# Patient Record
Sex: Female | Born: 1965 | Race: White | Hispanic: No | Smoking: Current every day smoker
Health system: Southern US, Community
[De-identification: ages and names within clinical notes are randomized; demographics above are authoritative.]

## PROBLEM LIST (undated history)

## (undated) DIAGNOSIS — C801 Malignant (primary) neoplasm, unspecified: Secondary | ICD-10-CM

## (undated) DIAGNOSIS — I10 Essential (primary) hypertension: Secondary | ICD-10-CM

## (undated) DIAGNOSIS — E079 Disorder of thyroid, unspecified: Secondary | ICD-10-CM

## (undated) HISTORY — PX: APPENDECTOMY: SHX54

## (undated) HISTORY — PX: DENTAL SURGERY: SHX609

## (undated) HISTORY — DX: Disorder of thyroid, unspecified: E07.9

---

## 2007-11-15 HISTORY — PX: ABDOMINAL HYSTERECTOMY: SHX81

## 2008-11-14 HISTORY — PX: TONSILLECTOMY: SUR1361

## 2012-11-14 DIAGNOSIS — I10 Essential (primary) hypertension: Secondary | ICD-10-CM

## 2012-11-14 HISTORY — DX: Essential (primary) hypertension: I10

## 2013-10-25 ENCOUNTER — Emergency Department: Payer: Self-pay | Admitting: Emergency Medicine

## 2013-11-04 ENCOUNTER — Emergency Department: Payer: Self-pay | Admitting: Emergency Medicine

## 2013-11-08 ENCOUNTER — Emergency Department: Payer: Self-pay | Admitting: Emergency Medicine

## 2015-05-22 ENCOUNTER — Ambulatory Visit
Admission: EM | Admit: 2015-05-22 | Discharge: 2015-05-22 | Disposition: A | Discharge status: Home / Self Care | Payer: Managed Care, Other (non HMO) | Attending: Family Medicine | Admitting: Family Medicine

## 2015-05-22 DIAGNOSIS — R0602 Shortness of breath: Secondary | ICD-10-CM | POA: Insufficient documentation

## 2015-05-22 DIAGNOSIS — J9801 Acute bronchospasm: Secondary | ICD-10-CM

## 2015-05-22 DIAGNOSIS — I10 Essential (primary) hypertension: Secondary | ICD-10-CM | POA: Diagnosis not present

## 2015-05-22 HISTORY — DX: Essential (primary) hypertension: I10

## 2015-05-22 LAB — BASIC METABOLIC PANEL
Anion gap: 13 (ref 5–15)
BUN: 13 mg/dL (ref 6–20)
CO2: 25 mmol/L (ref 22–32)
CREATININE: 0.99 mg/dL (ref 0.44–1.00)
Calcium: 9.9 mg/dL (ref 8.9–10.3)
Chloride: 95 mmol/L — ABNORMAL LOW (ref 101–111)
GFR calc Af Amer: >60 mL/min (ref >60)
GFR calc non Af Amer: >60 mL/min (ref >60)
Glucose, Bld: 103 mg/dL — ABNORMAL HIGH (ref 65–99)
POTASSIUM: 3.6 mmol/L (ref 3.5–5.1)
Sodium: 133 mmol/L — ABNORMAL LOW (ref 135–145)

## 2015-05-22 MED ORDER — ALBUTEROL SULFATE HFA 108 (90 BASE) MCG/ACT IN AERS: 1.0000 | INHALATION_SPRAY | Freq: Four times a day (QID) | RESPIRATORY_TRACT | Status: DC | PRN

## 2015-05-22 MED ORDER — HYDROCHLOROTHIAZIDE 12.5 MG PO TABS: 12.5000 mg | ORAL_TABLET | Freq: Every day | ORAL | Status: DC

## 2015-05-22 NOTE — ED Notes (Signed)
Pt states "I have had shortness of breath and some chest pain over the last few days. I have been off my blood pressure medicines for years. I stopped it because it made me feel bad."

## 2015-05-22 NOTE — ED Provider Notes (Signed)
CSN: [id number]     Arrival date & time 05/22/15  1133 History   First MD Initiated Contact with Patient 05/22/15 1246     Chief Complaint  Patient presents with  . Shortness of Breath  . Hypertension   (Consider location/radiation/quality/duration/timing/severity/associated sxs/prior Treatment) HPI Comments: 49 yo female with at least 3 months h/o intermittent shortness of breath and intermittent epigastric pains. States has a h/o hypertension, however self discontinued her medications because "I didn't want to take any more pills". Patient is also a smoker,states gets occasional wheezing, and has used albuterol in the past. Has also been taking otc "BC powders". Currently denies any chest pains or shortness of breath.   Patient is a 49 y.o. female presenting with shortness of breath and hypertension. The history is provided by the patient.  Shortness of Breath Severity:  Mild Onset quality:  Gradual Duration:  3 months Timing:  Intermittent Progression:  Waxing and waning Context: activity   Hypertension Associated symptoms include shortness of breath.    Past Medical History  Diagnosis Date  . Hypertension    Past Surgical History  Procedure Laterality Date  . Cesarean section    . Tonsillectomy    . Appendectomy    . Abdominal hysterectomy     No family history on file. History  Substance Use Topics  . Smoking status: Current Every Day Smoker -- 1.00 packs/day  . Smokeless tobacco: Not on file  . Alcohol Use: 1.2 - 2.4 oz/week    2-4 Cans of beer per week     Comment: 2-4 q day   OB History    No data available     Review of Systems  Respiratory: Positive for shortness of breath.     Allergies  Review of patient's allergies indicates no known allergies.  Home Medications   Prior to Admission medications   Medication Sig Start Date End Date Taking? Authorizing Provider  Aspirin-Salicylamide-Caffeine (BC HEADACHE POWDER PO) Take by mouth.   Yes Historical  Provider, MD  ibuprofen (ADVIL,MOTRIN) 200 MG tablet Take 400 mg by mouth every 6 (six) hours as needed.   Yes Historical Provider, MD  albuterol (PROVENTIL HFA;VENTOLIN HFA) 108 (90 BASE) MCG/ACT inhaler Inhale 1-2 puffs into the lungs every 6 (six) hours as needed for wheezing or shortness of breath. 05/22/15   Norval Gable, MD  hydrochlorothiazide (HYDRODIURIL) 12.5 MG tablet Take 1 tablet (12.5 mg total) by mouth daily. 05/22/15   Norval Gable, MD   BP 151/98 mmHg  Pulse 90  Temp(Src) 98.1 F (36.7 C) (Tympanic)  Resp 16  Ht 5' 0.5" (1.537 m)  Wt 148 lb (67.132 kg)  BMI 28.42 kg/m2  SpO2 100%  LMP  Physical Exam  Constitutional: She appears well-developed and well-nourished. No distress.  HENT:  Head: Normocephalic.  Nose: Nose normal.  Mouth/Throat: Oropharynx is clear and moist and mucous membranes are normal.  Eyes: Conjunctivae and EOM are normal. Pupils are equal, round, and reactive to light. Right eye exhibits no discharge. Left eye exhibits no discharge. No scleral icterus.  Neck: Normal range of motion. Neck supple. No JVD present. No tracheal deviation present. No thyromegaly present.  Cardiovascular: Normal rate, regular rhythm, normal heart sounds and intact distal pulses.   No murmur heard. Pulmonary/Chest: Effort normal. No stridor. No respiratory distress. She has wheezes (few expiratory). She has no rales. She exhibits no tenderness.  Abdominal: Soft. Bowel sounds are normal. She exhibits no distension. There is no tenderness. There is no rebound  and no guarding.  Musculoskeletal: She exhibits no edema.  Lymphadenopathy:    She has no cervical adenopathy.  Neurological: She is alert.  Skin: Skin is warm and dry. No rash noted. She is not diaphoretic.  Vitals reviewed.   ED Course  Procedures (including critical care time) Labs Review Labs Reviewed  BASIC METABOLIC PANEL - Abnormal; Notable for the following:    Sodium 133 (*)    Chloride 95 (*)    Glucose,  Bld 103 (*)    All other components within normal limits    Imaging Review No results found.  EKG: normal EKG, normal sinus rhythm, there are no previous tracings available for comparison; reviewed by me and agree with computerized print out. MDM   1. Bronchospasm   2. Essential hypertension    Discharge Medication List as of 05/22/2015  1:54 PM    START taking these medications   Details  albuterol (PROVENTIL HFA;VENTOLIN HFA) 108 (90 BASE) MCG/ACT inhaler Inhale 1-2 puffs into the lungs every 6 (six) hours as needed for wheezing or shortness of breath., Starting 05/22/2015, Until Discontinued, Normal    hydrochlorothiazide (HYDRODIURIL) 12.5 MG tablet Take 1 tablet (12.5 mg total) by mouth daily., Starting 05/22/2015, Until Discontinued, Normal      Plan: 1. Test results and diagnosis reviewed with patient 2. rx as per orders; risks, benefits, potential side effects reviewed with patient 3. Recommend smoking cessation and establish with a PCP in the next 1-2 weeks 4. F/u prn if symptoms worsen or don't improve    Norval Gable, MD 05/22/15 1401

## 2015-06-01 ENCOUNTER — Ambulatory Visit (INDEPENDENT_AMBULATORY_CARE_PROVIDER_SITE_OTHER): Payer: Managed Care, Other (non HMO) | Admitting: Family Medicine

## 2015-06-01 ENCOUNTER — Encounter: Payer: Self-pay | Admitting: Family Medicine

## 2015-06-01 VITALS — BP 174/100 | HR 88 | Ht 61.5 in | Wt 150.2 lb

## 2015-06-01 DIAGNOSIS — Z0000 Encounter for general adult medical examination without abnormal findings: Secondary | ICD-10-CM

## 2015-06-01 DIAGNOSIS — M545 Low back pain, unspecified: Secondary | ICD-10-CM | POA: Insufficient documentation

## 2015-06-01 DIAGNOSIS — Z720 Tobacco use: Secondary | ICD-10-CM

## 2015-06-01 DIAGNOSIS — E663 Overweight: Secondary | ICD-10-CM | POA: Insufficient documentation

## 2015-06-01 DIAGNOSIS — E039 Hypothyroidism, unspecified: Secondary | ICD-10-CM | POA: Diagnosis not present

## 2015-06-01 DIAGNOSIS — R739 Hyperglycemia, unspecified: Secondary | ICD-10-CM

## 2015-06-01 DIAGNOSIS — J449 Chronic obstructive pulmonary disease, unspecified: Secondary | ICD-10-CM

## 2015-06-01 DIAGNOSIS — F17200 Nicotine dependence, unspecified, uncomplicated: Secondary | ICD-10-CM | POA: Insufficient documentation

## 2015-06-01 DIAGNOSIS — I10 Essential (primary) hypertension: Secondary | ICD-10-CM | POA: Diagnosis not present

## 2015-06-01 DIAGNOSIS — E559 Vitamin D deficiency, unspecified: Secondary | ICD-10-CM

## 2015-06-01 MED ORDER — LISINOPRIL-HYDROCHLOROTHIAZIDE 10-12.5 MG PO TABS: 1.0000 | ORAL_TABLET | Freq: Every day | ORAL | Status: DC

## 2015-06-01 NOTE — Progress Notes (Signed)
Date:  06/01/2015   Name:  Tamara Olson   DOB:  1966/04/04   MRN:  [id number]  PCP:  No PCP Per Patient    Chief Complaint: Hypertension   History of Present Illness:  This is a 50 y.o. female seen last week at [facility] and started on low dose HCTZ and albuterol inhaler for HTN and bronchospasm. Had been on both meds in past but recently reestablished insurance. Using albuterol prn only when weather hot. Smoker for many years, not interested in quitting at present. Takes ibuprofen/BC powder prn back pain. Weight stable, gets exercise working at [employer] No gyn exam or mammogram in 8 years due to no insurance, no colonoscopy in past, last tetanus 3 yrs ago.  Review of Systems:  Review of Systems  Constitutional: Negative for fever, appetite change and unexpected weight change.  HENT: Negative for ear pain and sore throat.   Eyes: Negative for pain.  Respiratory: Positive for shortness of breath and wheezing. Negative for cough.   Cardiovascular: Negative for chest pain, palpitations and leg swelling.  Gastrointestinal: Negative for abdominal pain.  Endocrine: Negative for polyuria.  Genitourinary: Negative for dysuria and difficulty urinating.  Musculoskeletal: Positive for back pain. Negative for joint swelling.  Skin: Negative for rash.  Neurological: Negative for seizures and syncope.  Hematological: Negative for adenopathy.  Psychiatric/Behavioral: Negative for sleep disturbance and decreased concentration.    Patient Active Problem List   Diagnosis Date Noted  . Overweight 06/01/2015  . Essential hypertension 06/01/2015  . Smoker 06/01/2015  . 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 Intermittent low back pain 06/01/2015    Prior to Admission medications   Medication Sig Start Date End Date Taking? Authorizing Provider  albuterol (PROVENTIL HFA;VENTOLIN HFA) 108 (90 BASE) MCG/ACT inhaler Inhale 1-2 puffs into the lungs every 6 (six) hours as needed for wheezing or shortness of breath. 05/22/15   Yes Norval Gable, MD  Aspirin-Salicylamide-Caffeine (BC HEADACHE POWDER PO) Take by mouth.   Yes Historical Provider, MD  ibuprofen (ADVIL,MOTRIN) 200 MG tablet Take 400 mg by mouth every 6 (six) hours as needed.   Yes Historical Provider, MD  lisinopril-hydrochlorothiazide (PRINZIDE,ZESTORETIC) 10-12.5 MG per tablet Take 1 tablet by mouth daily. 06/01/15   Adline Potter, MD    No Known Allergies  Past Surgical History  Procedure Laterality Date  . Cesarean section    . Tonsillectomy    . Appendectomy    . Abdominal hysterectomy      History  Substance Use Topics  . Smoking status: Current Every Day Smoker -- 1.00 packs/day  . Smokeless tobacco: Not on file  . Alcohol Use: 1.2 - 2.4 oz/week    2-4 Cans of beer per week     Comment: 2-4 q day    Family History  Problem Relation Age of Onset  . Hypertension Mother   . Vision loss Mother   . Hypertension Father   . Heart disease Father   . Vision loss Father   . Hypothyroidism Sister   . Hypertension Sister   . Vision loss Sister     Medication list has been reviewed and updated.  Physical Examination: BP 174/100 mmHg  Pulse 88  Ht 5' 1.5" (1.562 m)  Wt 150 lb 3.2 oz (68.13 kg)  BMI 27.92 kg/m2  Physical Exam  Constitutional: She is oriented to person, place, and time. She appears well-developed and well-nourished.  HENT:  Head: Normocephalic and atraumatic.  Right Ear: External ear normal.  Left Ear: External ear  normal.  Mouth/Throat: Oropharynx is clear and moist.  Eyes: Conjunctivae and EOM are normal. Pupils are equal, round, and reactive to light.  Neck: No thyromegaly present.  Cardiovascular: Normal rate, regular rhythm and normal heart sounds.   Pulmonary/Chest: Effort normal and breath sounds normal.  Abdominal: Soft. She exhibits no distension and no mass. There is no tenderness.  Musculoskeletal: She exhibits no edema.  Lymphadenopathy:    She has no cervical adenopathy.  Neurological: She is  alert and oriented to person, place, and time. Coordination normal.  Skin: No rash noted.  Psychiatric: She has a normal mood and affect. Her behavior is normal.    Assessment and Plan:  1. Essential hypertension Poor control, convert to ACEI/HCTZ - CBC - Lipid Profile - lisinopril-hydrochlorothiazide (PRINZIDE,ZESTORETIC) 10-12.5 MG per tablet; Take 1 tablet by mouth daily.  Dispense: 30 tablet; Refill: 2  2. Overweight Discussed weight loss/exercise - TSH - Comprehensive Metabolic Panel (CMET) - Vitamin D (25 hydroxy)  3. Hyperglycemia - HgB A1c  4. COPD, mild Discussed need to quit smoking and prn albuterol use only  5. Smoker  6. Intermittent low back pain  7. Routine health maintenance - Ambulatory referral to Gynecology   Return in about 4 weeks (around 06/29/2015).  Satira Anis. [facility]  06/01/2015

## 2015-06-02 DIAGNOSIS — E559 Vitamin D deficiency, unspecified: Secondary | ICD-10-CM | POA: Insufficient documentation

## 2015-06-02 DIAGNOSIS — E039 Hypothyroidism, unspecified: Secondary | ICD-10-CM | POA: Insufficient documentation

## 2015-06-02 LAB — COMPREHENSIVE METABOLIC PANEL
ALT: 11 IU/L (ref 0–32)
AST: 15 IU/L (ref 0–40)
Albumin/Globulin Ratio: 2 (ref 1.1–2.5)
Albumin: 4.3 g/dL (ref 3.5–5.5)
Alkaline Phosphatase: 56 IU/L (ref 39–117)
BUN/Creatinine Ratio: 13 (ref 9–23)
BUN: 15 mg/dL (ref 6–24)
Bilirubin Total: <0.2 mg/dL (ref 0.0–1.2)
CHLORIDE: 97 mmol/L (ref 97–108)
CO2: 22 mmol/L (ref 18–29)
Calcium: 9.7 mg/dL (ref 8.7–10.2)
Creatinine, Ser: 1.12 mg/dL — ABNORMAL HIGH (ref 0.57–1.00)
GFR calc Af Amer: 66 mL/min/{1.73_m2} (ref >59)
GFR calc non Af Amer: 57 mL/min/{1.73_m2} — ABNORMAL LOW (ref >59)
GLOBULIN, TOTAL: 2.2 g/dL (ref 1.5–4.5)
Glucose: 87 mg/dL (ref 65–99)
Potassium: 3.8 mmol/L (ref 3.5–5.2)
SODIUM: 136 mmol/L (ref 134–144)
TOTAL PROTEIN: 6.5 g/dL (ref 6.0–8.5)

## 2015-06-02 LAB — TSH: TSH: 7.54 u[IU]/mL — ABNORMAL HIGH (ref 0.450–4.500)

## 2015-06-02 LAB — CBC
Hematocrit: 43.5 % (ref 34.0–46.6)
Hemoglobin: 14.3 g/dL (ref 11.1–15.9)
MCH: 31.6 pg (ref 26.6–33.0)
MCHC: 32.9 g/dL (ref 31.5–35.7)
MCV: 96 fL (ref 79–97)
Platelets: 329 10*3/uL (ref 150–379)
RBC: 4.52 x10E6/uL (ref 3.77–5.28)
RDW: 13.1 % (ref 12.3–15.4)
WBC: 8.6 10*3/uL (ref 3.4–10.8)

## 2015-06-02 LAB — LIPID PANEL
CHOL/HDL RATIO: 3.4 ratio (ref 0.0–4.4)
Cholesterol, Total: 202 mg/dL — ABNORMAL HIGH (ref 100–199)
HDL: 60 mg/dL (ref >39)
LDL Calculated: 108 mg/dL — ABNORMAL HIGH (ref 0–99)
TRIGLYCERIDES: 168 mg/dL — AB (ref 0–149)
VLDL Cholesterol Cal: 34 mg/dL (ref 5–40)

## 2015-06-02 LAB — HEMOGLOBIN A1C
Est. average glucose Bld gHb Est-mCnc: 111 mg/dL
Hgb A1c MFr Bld: 5.5 % (ref 4.8–5.6)

## 2015-06-02 LAB — VITAMIN D 25 HYDROXY (VIT D DEFICIENCY, FRACTURES): Vit D, 25-Hydroxy: 14 ng/mL — ABNORMAL LOW (ref 30.0–100.0)

## 2015-06-02 MED ORDER — VITAMIN D 50 MCG (2000 UT) PO CAPS: 1.0000 | ORAL_CAPSULE | Freq: Every day | ORAL | Status: DC

## 2015-06-02 MED ORDER — LEVOTHYROXINE SODIUM 25 MCG PO TABS: 25.0000 ug | ORAL_TABLET | Freq: Every day | ORAL | Status: DC

## 2015-06-02 NOTE — Addendum Note (Signed)
Addended by: Adline Potter on: 06/02/2015 08:54 AM   Modules accepted: Orders, SmartSet

## 2015-06-11 ENCOUNTER — Encounter: Payer: Self-pay | Admitting: General Surgery

## 2015-06-23 ENCOUNTER — Ambulatory Visit: Payer: Self-pay | Admitting: General Surgery

## 2015-07-02 ENCOUNTER — Ambulatory Visit (INDEPENDENT_AMBULATORY_CARE_PROVIDER_SITE_OTHER): Payer: Managed Care, Other (non HMO) | Admitting: Family Medicine

## 2015-07-02 ENCOUNTER — Encounter: Payer: Self-pay | Admitting: Family Medicine

## 2015-07-02 VITALS — BP 118/80 | HR 84 | Ht 61.5 in | Wt 151.0 lb

## 2015-07-02 DIAGNOSIS — F17200 Nicotine dependence, unspecified, uncomplicated: Secondary | ICD-10-CM

## 2015-07-02 DIAGNOSIS — E663 Overweight: Secondary | ICD-10-CM | POA: Diagnosis not present

## 2015-07-02 DIAGNOSIS — E559 Vitamin D deficiency, unspecified: Secondary | ICD-10-CM | POA: Diagnosis not present

## 2015-07-02 DIAGNOSIS — J449 Chronic obstructive pulmonary disease, unspecified: Secondary | ICD-10-CM

## 2015-07-02 DIAGNOSIS — I10 Essential (primary) hypertension: Secondary | ICD-10-CM

## 2015-07-02 DIAGNOSIS — Z720 Tobacco use: Secondary | ICD-10-CM

## 2015-07-02 DIAGNOSIS — E039 Hypothyroidism, unspecified: Secondary | ICD-10-CM | POA: Diagnosis not present

## 2015-07-02 NOTE — Progress Notes (Signed)
Date:  07/02/2015   Name:  Tamara Olson   DOB:  [birth year]   MRN:  [id number]  PCP:  No PCP Per Patient    Chief Complaint: COPD and Hypertension   History of Present Illness:  This is a 49 y.o. female with HTN placed on lisinopril/HCTZ last visit, states feels better, no se's noted. Blood work showed hypothyroid and low vit D, now on Synthroid and vit D supplement. Saw GYN and had Pap and mammo, scheduled for GI eval for colonoscopy 07/23/15. Still smoking but has cut back and says breathing better, using albuterol MDI prn only, not ready to quit. Still no exercise outside of work and no weight loss since last visit.  Review of Systems:  Review of Systems  Constitutional: Negative for activity change, appetite change and unexpected weight change.  Respiratory: Negative for shortness of breath.   Cardiovascular: Negative for chest pain and leg swelling.    Patient Active Problem List   Diagnosis Date Noted  . Vitamin D deficiency 06/02/2015  . Hypothyroidism 06/02/2015  . Overweight 06/01/2015  . Essential hypertension 06/01/2015  . Smoker 06/01/2015  . 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 Intermittent low back pain 06/01/2015    Prior to Admission medications   Medication Sig Start Date End Date Taking? Authorizing Provider  albuterol (PROVENTIL HFA;VENTOLIN HFA) 108 (90 BASE) MCG/ACT inhaler Inhale 1-2 puffs into the lungs every 6 (six) hours as needed for wheezing or shortness of breath. 05/22/15  Yes Norval Gable, MD  Aspirin-Salicylamide-Caffeine (BC HEADACHE POWDER PO) Take by mouth.   Yes Historical Provider, MD  Cholecalciferol (VITAMIN D) 2000 UNITS CAPS Take 1 capsule (2,000 Units total) by mouth daily. 06/02/15  Yes Adline Potter, MD  ibuprofen (ADVIL,MOTRIN) 200 MG tablet Take 400 mg by mouth every 6 (six) hours as needed.   Yes Historical Provider, MD  levothyroxine (LEVOTHROID) 25 MCG tablet Take 1 tablet (25 mcg total) by mouth daily before breakfast. 06/02/15  Yes Adline Potter,  MD  lisinopril-hydrochlorothiazide (PRINZIDE,ZESTORETIC) 10-12.5 MG per tablet Take 1 tablet by mouth daily. 06/01/15  Yes Adline Potter, MD    No Known Allergies  Past Surgical History  Procedure Laterality Date  . Cesarean section    . Tonsillectomy    . Appendectomy    . Abdominal hysterectomy      Social History  Substance Use Topics  . Smoking status: Current Every Day Smoker -- 1.00 packs/day  . Smokeless tobacco: None     Comment: Patient not ready to quit yet  . Alcohol Use: 1.2 - 2.4 oz/week    2-4 Cans of beer per week     Comment: 2-4 q day    Family History  Problem Relation Age of Onset  . Hypertension Mother   . Vision loss Mother   . Hypertension Father   . Heart disease Father   . Vision loss Father   . Hypothyroidism Sister   . Hypertension Sister   . Vision loss Sister     Medication list has been reviewed and updated.  Physical Examination: BP 118/80 mmHg  Pulse 84  Ht 5' 1.5" (1.562 m)  Wt 151 lb (68.493 kg)  BMI 28.07 kg/m2  Physical Exam  Constitutional: She appears well-developed and well-nourished.  Eyes: No scleral icterus.  Neck: No thyromegaly present.  Cardiovascular: Normal rate, regular rhythm and normal heart sounds.   Pulmonary/Chest: Effort normal and breath sounds normal.  Musculoskeletal: She exhibits no edema.  Neurological: She is alert.  Skin:  Skin is warm and dry. No rash noted.  Psychiatric: She has a normal mood and affect. Her behavior is normal.    Assessment and Plan:  1. Essential hypertension Well controlled now, continue lisinopril/HCTZ, refill if BMP ok - Basic Metabolic Panel (BMET)  2. COPD, mild Sxs improved, continue prn albuterol, importance of smoking cessation discussed  3. Hypothyroidism, unspecified hypothyroidism type Continue Synthroid, refill if TSH ok - TSH  4. Overweight Discussed benefits of increased exercise/weight loss  5. Smoker Advised cessation  6. Vitamin D  deficiency Continue supplementation - Vitamin D (25 hydroxy)  Return in about 6 months (around 01/02/2016).  Satira Anis. [facility]  07/02/2015

## 2015-07-03 LAB — BASIC METABOLIC PANEL
BUN/Creatinine Ratio: 17 (ref 9–23)
BUN: 15 mg/dL (ref 6–24)
CALCIUM: 9.7 mg/dL (ref 8.7–10.2)
CO2: 22 mmol/L (ref 18–29)
Chloride: 94 mmol/L — ABNORMAL LOW (ref 97–108)
Creatinine, Ser: 0.86 mg/dL (ref 0.57–1.00)
GFR, EST AFRICAN AMERICAN: 91 mL/min/{1.73_m2} (ref >59)
GFR, EST NON AFRICAN AMERICAN: 79 mL/min/{1.73_m2} (ref >59)
Glucose: 65 mg/dL (ref 65–99)
POTASSIUM: 3.6 mmol/L (ref 3.5–5.2)
Sodium: 136 mmol/L (ref 134–144)

## 2015-07-03 LAB — TSH: TSH: 4.07 u[IU]/mL (ref 0.450–4.500)

## 2015-07-03 LAB — VITAMIN D 25 HYDROXY (VIT D DEFICIENCY, FRACTURES): Vit D, 25-Hydroxy: 26.6 ng/mL — ABNORMAL LOW (ref 30.0–100.0)

## 2015-07-23 ENCOUNTER — Encounter: Payer: Self-pay | Admitting: General Surgery

## 2015-07-23 ENCOUNTER — Ambulatory Visit (INDEPENDENT_AMBULATORY_CARE_PROVIDER_SITE_OTHER): Payer: Managed Care, Other (non HMO) | Admitting: General Surgery

## 2015-07-23 DIAGNOSIS — Z1211 Encounter for screening for malignant neoplasm of colon: Secondary | ICD-10-CM

## 2015-07-23 NOTE — Patient Instructions (Addendum)
Colonoscopy A colonoscopy is an exam to look at the entire large intestine (colon). This exam can help find problems such as tumors, polyps, inflammation, and areas of bleeding. The exam takes about 1 hour.  LET [facility] CARE PROVIDER KNOW ABOUT:   Any allergies you have.  All medicines you are taking, including vitamins, herbs, eye drops, creams, and over-the-counter medicines.  Previous problems you or members of your family have had with the use of anesthetics.  Any blood disorders you have.  Previous surgeries you have had.  Medical conditions you have. RISKS AND COMPLICATIONS  Generally, this is a safe procedure. However, as with any procedure, complications can occur. Possible complications include:  Bleeding.  Tearing or rupture of the colon wall.  Reaction to medicines given during the exam.  Infection (rare). BEFORE THE PROCEDURE   Ask your health care provider about changing or stopping your regular medicines.  You may be prescribed an oral bowel prep. This involves drinking a large amount of medicated liquid, starting the day before your procedure. The liquid will cause you to have multiple loose stools until your stool is almost clear or light green. This cleans out your colon in preparation for the procedure.  Do not eat or drink anything else once you have started the bowel prep, unless your health care provider tells you it is safe to do so.  Arrange for someone to drive you home after the procedure. PROCEDURE   You will be given medicine to help you relax (sedative).  You will lie on your side with your knees bent.  A long, flexible tube with a light and camera on the end (colonoscope) will be inserted through the rectum and into the colon. The camera sends video back to a computer screen as it moves through the colon. The colonoscope also releases carbon dioxide gas to inflate the colon. This helps your health care provider see the area better.  During  the exam, your health care provider may take a small tissue sample (biopsy) to be examined under a microscope if any abnormalities are found.  The exam is finished when the entire colon has been viewed. AFTER THE PROCEDURE   Do not drive for 24 hours after the exam.  You may have a small amount of blood in your stool.  You may pass moderate amounts of gas and have mild abdominal cramping or bloating. This is caused by the gas used to inflate your colon during the exam.  Ask when your test results will be ready and how you will get your results. Make sure you get your test results. Document Released: 10/28/2000 Document Revised: 08/21/2013 Document Reviewed: 07/08/2013 [facility] Patient Information 2015 [place], [place]. This information is not intended to replace advice given to you by your health care provider. Make sure you discuss any questions you have with your health care provider.   Patient wishes to have colonoscopy in December 2016. She will be contacted once schedule is available to arrange a date. Miralax prescription will be sent in at that time.

## 2015-07-23 NOTE — Progress Notes (Signed)
Patient ID: Tamara Olson, female   DOB: 12/19/1965, 49 y.o.   MRN: [id number]  Chief Complaint  Patient presents with  . Colonoscopy    HPI Tamara Olson is a 49 y.o. female.  Here today to discuss having a colonoscopy. Denies any gastrointestinal issues. Bowels move at least every other day, no bleeding. No family history of colon cancer.   HPI  Past Medical History  Diagnosis Date  . Hypertension 2014    Past Surgical History  Procedure Laterality Date  . Tonsillectomy  2010  . Cesarean section  1989  . Abdominal hysterectomy  2009  . Appendectomy  1990's    Family History  Problem Relation Age of Onset  . Hypertension Mother   . Vision loss Mother   . Hypertension Father   . Heart disease Father   . Vision loss Father   . Hypothyroidism Sister   . Hypertension Sister   . Vision loss Sister     Social History Social History  Substance Use Topics  . Smoking status: Current Every Day Smoker -- 1.00 packs/day for 30 years  . Smokeless tobacco: Never Used     Comment: Patient not ready to quit yet  . Alcohol Use: 1.2 - 2.4 oz/week    2-4 Cans of beer per week     Comment: 2-4 q day    No Known Allergies  Current Outpatient Prescriptions  Medication Sig Dispense Refill  . albuterol (PROVENTIL HFA;VENTOLIN HFA) 108 (90 BASE) MCG/ACT inhaler Inhale 1-2 puffs into the lungs every 6 (six) hours as needed for wheezing or shortness of breath. 1 Inhaler 0  . Aspirin-Salicylamide-Caffeine (BC HEADACHE POWDER PO) Take by mouth.    . Cholecalciferol (VITAMIN D) 2000 UNITS CAPS Take 1 capsule (2,000 Units total) by mouth daily. 30 capsule   . ibuprofen (ADVIL,MOTRIN) 200 MG tablet Take 400 mg by mouth every 6 (six) hours as needed.    Marland Kitchen levothyroxine (LEVOTHROID) 25 MCG tablet Take 1 tablet (25 mcg total) by mouth daily before breakfast. 30 tablet 2  . lisinopril-hydrochlorothiazide (PRINZIDE,ZESTORETIC) 10-12.5 MG per tablet Take 1 tablet by mouth daily. 30 tablet 2    No current facility-administered medications for this visit.    Review of Systems Review of Systems  Constitutional: Negative.   Respiratory: Negative.   Cardiovascular: Negative.   Gastrointestinal: Negative for nausea, diarrhea, constipation and blood in stool.    Blood pressure 134/82, pulse 90, resp. rate 14, height 5' 5.5" (1.664 m), weight 151 lb (68.493 kg).  Physical Exam Physical Exam  Constitutional: She is oriented to person, place, and time. She appears well-developed and well-nourished.  HENT:  Mouth/Throat: Oropharynx is clear and moist.  Eyes: Conjunctivae are normal. No scleral icterus.  Neck: Neck supple.  Cardiovascular: Normal rate, regular rhythm and normal heart sounds.   Pulmonary/Chest: Effort normal and breath sounds normal.  Lymphadenopathy:    She has no cervical adenopathy.  Neurological: She is alert and oriented to person, place, and time.  Skin: Skin is warm and dry.  Psychiatric: Her behavior is normal.    Data Reviewed 06/01/2015 CBC, hemoglobin A1c, TSH, comprehensive metabolic panel reviewed. All normal.  Assessment    Candidate for screening colonoscopy.    Plan       Colonoscopy with possible biopsy/polypectomy prn: Information regarding the procedure, including its potential risks and complications (including but not limited to perforation of the bowel, which may require emergency surgery to repair, and bleeding) was verbally given to  the patient. Educational information regarding lower intestinal endoscopy was given to the patient. Written instructions for how to complete the bowel prep using Miralax were provided. The importance of drinking ample fluids to avoid dehydration as a result of the prep emphasized.  Patient wishes to have colonoscopy in December 2016. She will be contacted once schedule is available to arrange a date. Miralax prescription will be sent in at that time.   PCP:  Adline Potter at [facility] Ref  Dr Percell Boston, [place] 07/28/2015, 11:49 AM

## 2015-07-28 DIAGNOSIS — Z1211 Encounter for screening for malignant neoplasm of colon: Secondary | ICD-10-CM | POA: Insufficient documentation

## 2015-07-28 DIAGNOSIS — Z0000 Encounter for general adult medical examination without abnormal findings: Secondary | ICD-10-CM | POA: Insufficient documentation

## 2015-08-19 ENCOUNTER — Telehealth: Payer: Self-pay | Admitting: *Deleted

## 2015-08-19 NOTE — Telephone Encounter (Signed)
Message left for patient to call the office.   December schedule is now available and we can get patient's colonoscopy scheduled at her convenience. Miralax prescription will need to be sent in once date arranged.

## 2015-08-26 NOTE — Telephone Encounter (Signed)
Another message left for patient to call the office.

## 2015-08-27 ENCOUNTER — Telehealth: Payer: Self-pay

## 2015-08-27 DIAGNOSIS — I10 Essential (primary) hypertension: Secondary | ICD-10-CM

## 2015-08-27 MED ORDER — LISINOPRIL-HYDROCHLOROTHIAZIDE 10-12.5 MG PO TABS: 1.0000 | ORAL_TABLET | Freq: Every day | ORAL | Status: DC

## 2015-08-27 MED ORDER — LEVOTHYROXINE SODIUM 25 MCG PO TABS: 25.0000 ug | ORAL_TABLET | Freq: Every day | ORAL | Status: DC

## 2015-08-27 NOTE — Addendum Note (Signed)
Addended by: Adline Potter on: 08/27/2015 05:29 PM   Modules accepted: Orders

## 2015-08-27 NOTE — Telephone Encounter (Signed)
Lisinopril/HCTZ and Synthroid refilled

## 2015-09-01 ENCOUNTER — Other Ambulatory Visit: Payer: Self-pay

## 2015-09-01 DIAGNOSIS — I10 Essential (primary) hypertension: Secondary | ICD-10-CM

## 2015-09-01 MED ORDER — LISINOPRIL-HYDROCHLOROTHIAZIDE 10-12.5 MG PO TABS: 1.0000 | ORAL_TABLET | Freq: Every day | ORAL | Status: DC

## 2015-09-01 MED ORDER — LEVOTHYROXINE SODIUM 25 MCG PO TABS: 25.0000 ug | ORAL_TABLET | Freq: Every day | ORAL | Status: DC

## 2015-12-31 ENCOUNTER — Ambulatory Visit: Payer: Managed Care, Other (non HMO) | Admitting: Family Medicine

## 2016-01-01 ENCOUNTER — Ambulatory Visit (INDEPENDENT_AMBULATORY_CARE_PROVIDER_SITE_OTHER): Payer: Managed Care, Other (non HMO) | Admitting: Family Medicine

## 2016-01-01 ENCOUNTER — Encounter: Payer: Self-pay | Admitting: Family Medicine

## 2016-01-01 VITALS — BP 148/88 | HR 81 | Temp 98.2°F | Resp 16 | Ht 62.0 in | Wt 152.6 lb

## 2016-01-01 DIAGNOSIS — E559 Vitamin D deficiency, unspecified: Secondary | ICD-10-CM | POA: Diagnosis not present

## 2016-01-01 DIAGNOSIS — E663 Overweight: Secondary | ICD-10-CM | POA: Diagnosis not present

## 2016-01-01 DIAGNOSIS — I10 Essential (primary) hypertension: Secondary | ICD-10-CM

## 2016-01-01 DIAGNOSIS — Z720 Tobacco use: Secondary | ICD-10-CM | POA: Diagnosis not present

## 2016-01-01 DIAGNOSIS — J449 Chronic obstructive pulmonary disease, unspecified: Secondary | ICD-10-CM | POA: Diagnosis not present

## 2016-01-01 DIAGNOSIS — E039 Hypothyroidism, unspecified: Secondary | ICD-10-CM

## 2016-01-01 DIAGNOSIS — F17200 Nicotine dependence, unspecified, uncomplicated: Secondary | ICD-10-CM

## 2016-01-01 MED ORDER — ALBUTEROL SULFATE HFA 108 (90 BASE) MCG/ACT IN AERS: 2.0000 | INHALATION_SPRAY | Freq: Four times a day (QID) | RESPIRATORY_TRACT | Status: DC | PRN

## 2016-01-01 MED ORDER — LISINOPRIL-HYDROCHLOROTHIAZIDE 20-25 MG PO TABS: 1.0000 | ORAL_TABLET | Freq: Every day | ORAL | Status: DC

## 2016-01-01 NOTE — Progress Notes (Signed)
Date:  01/01/2016   Name:  Tamara Olson   DOB:  November 07, 1966   MRN:  [id number]  PCP:  Adline Potter, MD    Chief Complaint: Hypertension   History of Present Illness:  This is a 50 y.o. female for 6 month follow up. Started on Prinzide 7 months ago, tolerating well. Using albuterol prn only (none past 1.5 weeks), needs refill. New hypothyroid dx 7 months ago, TSH ok in August. Weight stable, gets exercise at work. Still smoking but has cut back. Taking vit D supplement.  Review of Systems:   Review of Systems  Constitutional: Negative for fever.  Respiratory: Negative for shortness of breath.   Cardiovascular: Negative for chest pain and leg swelling.  Gastrointestinal: Negative for abdominal pain.  Endocrine: Negative for polyuria.  Genitourinary: Negative for difficulty urinating.  Neurological: Negative for syncope and light-headedness.    Patient Active Problem List   Diagnosis Date Noted  . Encounter for screening colonoscopy 07/28/2015  . Vitamin D deficiency 06/02/2015  . Hypothyroidism 06/02/2015  . Overweight 06/01/2015  . Essential hypertension 06/01/2015  . Smoker 06/01/2015  . 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 Intermittent low back pain 06/01/2015    Prior to Admission medications   Medication Sig Start Date End Date Taking? Authorizing Provider  Aspirin-Salicylamide-Caffeine (BC HEADACHE POWDER PO) Take by mouth.   Yes Historical Provider, MD  Cholecalciferol (VITAMIN D) 2000 UNITS CAPS Take 1 capsule (2,000 Units total) by mouth daily. 06/02/15  Yes Adline Potter, MD  ibuprofen (ADVIL,MOTRIN) 200 MG tablet Take 400 mg by mouth every 6 (six) hours as needed.   Yes Historical Provider, MD  levothyroxine (LEVOTHROID) 25 MCG tablet Take 1 tablet (25 mcg total) by mouth daily before breakfast. 09/01/15  Yes Adline Potter, MD  albuterol (PROVENTIL HFA;VENTOLIN HFA) 108 (90 Base) MCG/ACT inhaler Inhale 2 puffs into the lungs every 6 (six) hours as needed for wheezing or  shortness of breath. 01/01/16   Adline Potter, MD  lisinopril-hydrochlorothiazide (PRINZIDE,ZESTORETIC) 20-25 MG tablet Take 1 tablet by mouth daily. 01/01/16   Adline Potter, MD    No Known Allergies  Past Surgical History  Procedure Laterality Date  . Tonsillectomy  2010  . Cesarean section  1989  . Abdominal hysterectomy  2009  . Appendectomy  1990's    Social History  Substance Use Topics  . Smoking status: Current Every Day Smoker -- 0.50 packs/day for 30 years    Types: Cigarettes  . Smokeless tobacco: Never Used     Comment: Patient not ready to quit yet  . Alcohol Use: 1.2 - 2.4 oz/week    2-4 Cans of beer per week     Comment: 2-4 q day    Family History  Problem Relation Age of Onset  . Hypertension Mother   . Vision loss Mother   . Hypertension Father   . Heart disease Father   . Vision loss Father   . Hypothyroidism Sister   . Hypertension Sister   . Vision loss Sister     Medication list has been reviewed and updated.  Physical Examination: BP 148/88 mmHg  Pulse 81  Temp(Src) 98.2 F (36.8 C)  Resp 16  Ht 5\' 2"  (1.575 m)  Wt 152 lb 9.6 oz (69.219 kg)  BMI 27.90 kg/m2  SpO2 99%  Physical Exam  Constitutional: She appears well-developed and well-nourished.  Cardiovascular: Normal rate, regular rhythm and normal heart sounds.   Pulmonary/Chest: Effort normal and breath sounds normal.  Musculoskeletal: She  exhibits no edema.  Neurological: She is alert.  Skin: Skin is warm and dry.  Psychiatric: She has a normal mood and affect. Her behavior is normal.  Nursing note and vitals reviewed.   Assessment and Plan:  1. Essential hypertension Marginal control, increase Prinzide to 20/25 one daily  2. COPD, mild (HCC) Refill prn albuterol  3. Hypothyroidism, unspecified hypothyroidism type On Synthroid x 6 months - TSH  4. Overweight Discussed exercise/weight loss  5. Smoker Strongly advised cessation  6. Vitamin D deficiency On  supplement - Vitamin D (25 hydroxy)  Return in about 6 months (around 06/30/2016).  Satira Anis. [facility]  01/01/2016

## 2016-01-02 LAB — VITAMIN D 25 HYDROXY (VIT D DEFICIENCY, FRACTURES): Vit D, 25-Hydroxy: 41.5 ng/mL (ref 30.0–100.0)

## 2016-01-02 LAB — TSH: TSH: 3.01 u[IU]/mL (ref 0.450–4.500)

## 2016-07-07 ENCOUNTER — Encounter: Payer: Self-pay | Admitting: Family Medicine

## 2016-07-07 ENCOUNTER — Ambulatory Visit (INDEPENDENT_AMBULATORY_CARE_PROVIDER_SITE_OTHER): Payer: Managed Care, Other (non HMO) | Admitting: Family Medicine

## 2016-07-07 VITALS — BP 120/80 | HR 88 | Ht 62.0 in | Wt 155.0 lb

## 2016-07-07 DIAGNOSIS — J449 Chronic obstructive pulmonary disease, unspecified: Secondary | ICD-10-CM

## 2016-07-07 DIAGNOSIS — K219 Gastro-esophageal reflux disease without esophagitis: Secondary | ICD-10-CM

## 2016-07-07 DIAGNOSIS — Z23 Encounter for immunization: Secondary | ICD-10-CM | POA: Diagnosis not present

## 2016-07-07 DIAGNOSIS — F17200 Nicotine dependence, unspecified, uncomplicated: Secondary | ICD-10-CM

## 2016-07-07 DIAGNOSIS — Z720 Tobacco use: Secondary | ICD-10-CM | POA: Diagnosis not present

## 2016-07-07 DIAGNOSIS — E039 Hypothyroidism, unspecified: Secondary | ICD-10-CM | POA: Diagnosis not present

## 2016-07-07 DIAGNOSIS — I10 Essential (primary) hypertension: Secondary | ICD-10-CM

## 2016-07-07 MED ORDER — PANTOPRAZOLE SODIUM 40 MG PO TBEC: 40.0000 mg | DELAYED_RELEASE_TABLET | Freq: Every day | ORAL | 1 refills | Status: DC

## 2016-07-07 MED ORDER — LISINOPRIL-HYDROCHLOROTHIAZIDE 20-25 MG PO TABS: 1.0000 | ORAL_TABLET | Freq: Every day | ORAL | 1 refills | Status: DC

## 2016-07-07 MED ORDER — ALBUTEROL SULFATE HFA 108 (90 BASE) MCG/ACT IN AERS: 2.0000 | INHALATION_SPRAY | Freq: Four times a day (QID) | RESPIRATORY_TRACT | 6 refills | Status: DC | PRN

## 2016-07-07 MED ORDER — LEVOTHYROXINE SODIUM 25 MCG PO TABS: 25.0000 ug | ORAL_TABLET | Freq: Every day | ORAL | 1 refills | Status: DC

## 2016-07-07 NOTE — Patient Instructions (Signed)
Smoking Cessation, Tips for Success If you are ready to quit smoking, congratulations! You have chosen to help yourself be healthier. Cigarettes bring nicotine, tar, carbon monoxide, and other irritants into your body. Your lungs, heart, and blood vessels will be able to work better without these poisons. There are many different ways to quit smoking. Nicotine gum, nicotine patches, a nicotine inhaler, or nicotine nasal spray can help with physical craving. Hypnosis, support groups, and medicines help break the habit of smoking. WHAT THINGS CAN I DO TO MAKE QUITTING EASIER?  Here are some tips to help you quit for good:  Pick a date when you will quit smoking completely. Tell all of your friends and family about your plan to quit on that date.  Do not try to slowly cut down on the number of cigarettes you are smoking. Pick a quit date and quit smoking completely starting on that day.  Throw away all cigarettes.   Clean and remove all ashtrays from your home, work, and car.  On a card, write down your reasons for quitting. Carry the card with you and read it when you get the urge to smoke.  Cleanse your body of nicotine. Drink enough water and fluids to keep your urine clear or pale yellow. Do this after quitting to flush the nicotine from your body.  Learn to predict your moods. Do not let a bad situation be your excuse to have a cigarette. Some situations in your life might tempt you into wanting a cigarette.  Never have "just one" cigarette. It leads to wanting another and another. Remind yourself of your decision to quit.  Change habits associated with smoking. If you smoked while driving or when feeling stressed, try other activities to replace smoking. Stand up when drinking your coffee. Brush your teeth after eating. Sit in a different chair when you read the paper. Avoid alcohol while trying to quit, and try to drink fewer caffeinated beverages. Alcohol and caffeine may urge you to  smoke.  Avoid foods and drinks that can trigger a desire to smoke, such as sugary or spicy foods and alcohol.  Ask people who smoke not to smoke around you.  Have something planned to do right after eating or having a cup of coffee. For example, plan to take a walk or exercise.  Try a relaxation exercise to calm you down and decrease your stress. Remember, you may be tense and nervous for the first 2 weeks after you quit, but this will pass.  Find new activities to keep your hands busy. Play with a pen, coin, or rubber band. Doodle or draw things on paper.  Brush your teeth right after eating. This will help cut down on the craving for the taste of tobacco after meals. You can also try mouthwash.   Use oral substitutes in place of cigarettes. Try using lemon drops, carrots, cinnamon sticks, or chewing gum. Keep them handy so they are available when you have the urge to smoke.  When you have the urge to smoke, try deep breathing.  Designate your home as a nonsmoking area.  If you are a heavy smoker, ask your health care provider about a prescription for nicotine chewing gum. It can ease your withdrawal from nicotine.  Reward yourself. Set aside the cigarette money you save and buy yourself something nice.  Look for support from others. Join a support group or smoking cessation program. Ask someone at home or at work to help you with your plan   to quit smoking.  Always ask yourself, "Do I need this cigarette or is this just a reflex?" Tell yourself, "Today, I choose not to smoke," or "I do not want to smoke." You are reminding yourself of your decision to quit.  Do not replace cigarette smoking with electronic cigarettes (commonly called e-cigarettes). The safety of e-cigarettes is unknown, and some may contain harmful chemicals.  If you relapse, do not give up! Plan ahead and think about what you will do the next time you get the urge to smoke. HOW WILL I FEEL WHEN I QUIT SMOKING? You  may have symptoms of withdrawal because your body is used to nicotine (the addictive substance in cigarettes). You may crave cigarettes, be irritable, feel very hungry, cough often, get headaches, or have difficulty concentrating. The withdrawal symptoms are only temporary. They are strongest when you first quit but will go away within 10-14 days. When withdrawal symptoms occur, stay in control. Think about your reasons for quitting. Remind yourself that these are signs that your body is healing and getting used to being without cigarettes. Remember that withdrawal symptoms are easier to treat than the major diseases that smoking can cause.  Even after the withdrawal is over, expect periodic urges to smoke. However, these cravings are generally short lived and will go away whether you smoke or not. Do not smoke! WHAT RESOURCES ARE AVAILABLE TO HELP ME QUIT SMOKING? Your health care provider can direct you to community resources or hospitals for support, which may include:  Group support.  Education.  Hypnosis.  Therapy.   This information is not intended to replace advice given to you by your health care provider. Make sure you discuss any questions you have with your health care provider.   Document Released: 07/29/2004 Document Revised: 11/21/2014 Document Reviewed: 04/18/2013 Elsevier Interactive Patient Education 2016 Elsevier Inc.  

## 2016-07-07 NOTE — Progress Notes (Signed)
Name: Tamara Olson   MRN: [id number]    DOB: 1966-04-07   Date:07/07/2016       Progress Note  Subjective  Chief Complaint  Chief Complaint  Patient presents with  . Hypothyroidism  . Hypertension    Hypertension  This is a chronic problem. The current episode started more than 1 year ago. The problem has been waxing and waning since onset. The problem is controlled. Pertinent negatives include no anxiety, blurred vision, chest pain, headaches, malaise/fatigue, neck pain, orthopnea, palpitations, peripheral edema, PND, shortness of breath or sweats. There are no associated agents to hypertension. There are no known risk factors for coronary artery disease. Past treatments include ACE inhibitors and diuretics. There are no compliance problems.  Hypertensive end-organ damage includes a thyroid problem. There is no history of angina, kidney disease, CAD/MI, CVA, heart failure, left ventricular hypertrophy, PVD, renovascular disease or retinopathy. There is no history of chronic renal disease or a hypertension causing med.  Thyroid Problem  Presents for follow-up visit. Symptoms include fatigue. Patient reports no anxiety, cold intolerance, constipation, depressed mood, diarrhea, dry skin, heat intolerance, hoarse voice, leg swelling, palpitations, visual change or weight loss. There is no history of heart failure.  Gastroesophageal Reflux  She reports no abdominal pain, no belching, no chest pain, no choking, no coughing, no dysphagia, no early satiety, no heartburn, no hoarse voice, no nausea, no sore throat, no water brash or no wheezing. This is a chronic problem. The current episode started more than 1 year ago. The problem has been gradually improving. Associated symptoms include fatigue. Pertinent negatives include no melena or weight loss.    No problem-specific Assessment & Plan notes found for this encounter.   Past Medical History:  Diagnosis Date  . Hypertension 2014  . Thyroid  disease     Past Surgical History:  Procedure Laterality Date  . ABDOMINAL HYSTERECTOMY  2009  . APPENDECTOMY  1990's  . CESAREAN SECTION  1989  . TONSILLECTOMY  2010    Family History  Problem Relation Age of Onset  . Hypertension Father   . Heart disease Father   . Vision loss Father   . Hypertension Mother   . Vision loss Mother   . Hypothyroidism Sister   . Hypertension Sister   . Vision loss Sister     Social History   Social History  . Marital status: Widowed    Spouse name: N/A  . Number of children: N/A  . Years of education: N/A   Occupational History  . Not on file.   Social History Main Topics  . Smoking status: Current Every Day Smoker    Packs/day: 0.50    Years: 30.00    Types: Cigarettes  . Smokeless tobacco: Never Used     Comment: Patient not ready to quit yet  . Alcohol use 1.2 - 2.4 oz/week    2 - 4 Cans of beer per week     Comment: 2-4 q day  . Drug use: No  . Sexual activity: Not on file   Other Topics Concern  . Not on file   Social History Narrative  . No narrative on file    No Known Allergies   Review of Systems  Constitutional: Positive for fatigue. Negative for chills, fever, malaise/fatigue and weight loss.  HENT: Negative for ear discharge, ear pain, hoarse voice and sore throat.   Eyes: Negative for blurred vision.  Respiratory: Negative for cough, sputum production, choking, shortness of breath  and wheezing.   Cardiovascular: Negative for chest pain, palpitations, orthopnea, leg swelling and PND.  Gastrointestinal: Negative for abdominal pain, blood in stool, constipation, diarrhea, dysphagia, heartburn, melena and nausea.  Genitourinary: Negative for dysuria, frequency, hematuria and urgency.  Musculoskeletal: Negative for back pain, joint pain, myalgias and neck pain.  Skin: Negative for rash.  Neurological: Negative for dizziness, tingling, sensory change, focal weakness and headaches.  Endo/Heme/Allergies:  Negative for environmental allergies, cold intolerance, heat intolerance and polydipsia. Does not bruise/bleed easily.  Psychiatric/Behavioral: Negative for depression and suicidal ideas. The patient is not nervous/anxious and does not have insomnia.      Objective  Vitals:   07/07/16 0841  BP: 120/80  Pulse: 88  Weight: 155 lb (70.3 kg)  Height: 5\' 2"  (1.575 m)    Physical Exam  Constitutional: She is well-developed, well-nourished, and in no distress. No distress.  HENT:  Head: Normocephalic and atraumatic.  Right Ear: External ear normal.  Left Ear: External ear normal.  Nose: Nose normal.  Mouth/Throat: Oropharynx is clear and moist.  Eyes: Conjunctivae and EOM are normal. Pupils are equal, round, and reactive to light. Right eye exhibits no discharge. Left eye exhibits no discharge.  Neck: Normal range of motion. Neck supple. No JVD present. No thyromegaly present.  Cardiovascular: Normal rate, regular rhythm, normal heart sounds and intact distal pulses.  Exam reveals no gallop and no friction rub.   No murmur heard. Pulmonary/Chest: Effort normal and breath sounds normal.  Abdominal: Soft. Bowel sounds are normal. She exhibits no mass. There is no tenderness. There is no guarding.  Musculoskeletal: Normal range of motion. She exhibits no edema.  Lymphadenopathy:    She has no cervical adenopathy.  Neurological: She is alert.  Skin: Skin is warm and dry. She is not diaphoretic.  Psychiatric: Mood and affect normal.  Nursing note and vitals reviewed.     Assessment & Plan  Problem List Items Addressed This Visit      Cardiovascular and Mediastinum   Essential hypertension - Primary   Relevant Medications   lisinopril-hydrochlorothiazide (PRINZIDE,ZESTORETIC) 20-25 MG tablet     Respiratory   COPD, mild (HCC)   Relevant Medications   albuterol (PROAIR HFA) 108 (90 Base) MCG/ACT inhaler     Endocrine   Hypothyroidism   Relevant Medications   levothyroxine  (LEVOTHROID) 25 MCG tablet     Other   Smoker    Other Visit Diagnoses    Gastroesophageal reflux disease, esophagitis presence not specified       Relevant Medications   pantoprazole (PROTONIX) 40 MG tablet   Need for Tdap vaccination       Relevant Orders   Tdap vaccine greater than or equal to 7yo IM (Completed)        Dr. Emilyann Banka [facility]  07/07/16

## 2017-07-27 ENCOUNTER — Ambulatory Visit
Admission: EM | Admit: 2017-07-27 | Discharge: 2017-07-27 | Disposition: A | Discharge status: Home / Self Care | Payer: Commercial Managed Care - PPO | Attending: Emergency Medicine | Admitting: Emergency Medicine

## 2017-07-27 DIAGNOSIS — K029 Dental caries, unspecified: Secondary | ICD-10-CM

## 2017-07-27 DIAGNOSIS — K047 Periapical abscess without sinus: Secondary | ICD-10-CM

## 2017-07-27 MED ORDER — HYDROCODONE-ACETAMINOPHEN 5-325 MG PO TABS: ORAL_TABLET | ORAL | 0 refills | Status: DC

## 2017-07-27 MED ORDER — PENICILLIN V POTASSIUM 500 MG PO TABS: 500.0000 mg | ORAL_TABLET | Freq: Four times a day (QID) | ORAL | 0 refills | Status: AC

## 2017-07-27 MED ORDER — IBUPROFEN 600 MG PO TABS: 600.0000 mg | ORAL_TABLET | Freq: Four times a day (QID) | ORAL | 0 refills | Status: DC | PRN

## 2017-07-27 NOTE — ED Provider Notes (Signed)
HPI  SUBJECTIVE:  Tamara Olson is a 51 y.o. female who presents with a "dental abscess". She states that she has bad teeth and has not seen a dentist in years. She reports throbbing, constant, left upper dental pain where a filling fell out approximately a year ago. She reports tender left-sided facial swelling starting today. She does not recall any recent trauma to the tooth. No fevers, sensation of throat swelling shut, difficulty swallowing, difficulty breathing, trismus. She states that she has a left-sided headache that seems to be radiating from the tooth. No antibiotics in the past month. She take ibuprofen within 6-8 hours of evaluation. She tried ibuprofen 400 mg with some improvement in her symptoms, salt water, salty fat back on her gums and Orajel. Symptoms are worse with eating. It is not sensitive to temperature changes or exposure to air. Past medical history of hypertension, she is a smoker. No history of smoking. PMD: Dr. Ronnald Ramp. Dentistry: None.   Past Medical History:  Diagnosis Date  . Hypertension 2014  . Thyroid disease     Past Surgical History:  Procedure Laterality Date  . ABDOMINAL HYSTERECTOMY  2009  . APPENDECTOMY  1990's  . CESAREAN SECTION  1989  . TONSILLECTOMY  2010    Family History  Problem Relation Age of Onset  . Hypertension Father   . Heart disease Father   . Vision loss Father   . Hypertension Mother   . Vision loss Mother   . Hypothyroidism Sister   . Hypertension Sister   . Vision loss Sister     Social History  Substance Use Topics  . Smoking status: Current Every Day Smoker    Packs/day: 1.00    Years: 30.00    Types: Cigarettes  . Smokeless tobacco: Never Used     Comment: Patient not ready to quit yet  . Alcohol use 1.2 - 2.4 oz/week    2 - 4 Cans of beer per week     Comment: 2-4 q day    No current facility-administered medications for this encounter.   Current Outpatient Prescriptions:  .  albuterol (PROAIR HFA) 108  (90 Base) MCG/ACT inhaler, Inhale 2 puffs into the lungs every 6 (six) hours as needed for wheezing or shortness of breath., Disp: 1 Inhaler, Rfl: 6 .  lisinopril-hydrochlorothiazide (PRINZIDE,ZESTORETIC) 20-25 MG tablet, Take 1 tablet by mouth daily., Disp: 90 tablet, Rfl: 1 .  HYDROcodone-acetaminophen (NORCO/VICODIN) 5-325 MG tablet, 1-2 tabs q 6hr prn pain, Disp: 20 tablet, Rfl: 0 .  ibuprofen (ADVIL,MOTRIN) 600 MG tablet, Take 1 tablet (600 mg total) by mouth every 6 (six) hours as needed., Disp: 30 tablet, Rfl: 0 .  penicillin v potassium (VEETID) 500 MG tablet, Take 1 tablet (500 mg total) by mouth 4 (four) times daily., Disp: 28 tablet, Rfl: 0  No Known Allergies   ROS  As noted in HPI.   Physical Exam  BP (!) 142/95 (BP Location: Left Arm)   Pulse 82   Temp 98.4 F (36.9 C) (Oral)   Resp 18   Ht 5\' 1"  (1.549 m)   Wt 145 lb (65.8 kg)   SpO2 99%   BMI 27.40 kg/m   Constitutional: Well developed, well nourished, no acute distress Eyes:  EOMI, conjunctiva normal bilaterally HENT: Normocephalic, atraumatic,mucus membranes moist. Positive tender left-sided facial swelling. Extensive dental decay. Tooth #14, first left upper molar decayed, tender to palpation. Positive surrounding gingival swelling and tenderness. No expressible purulent drainage. No trismus. Lymph: Positive left  sided cervical lymphadenopathy Respiratory: Normal inspiratory effort Cardiovascular: Normal rate GI: nondistended skin: No rash, skin intact Musculoskeletal: no deformities Neurologic: Alert & oriented x 3, no focal neuro deficits Psychiatric: Speech and behavior appropriate   ED Course   Medications - No data to display  No orders of the defined types were placed in this encounter.   No results found for this or any previous visit (from the past 24 hour(s)). No results found.  ED Clinical Impression  Dental abscess  Pain due to dental caries  ED Assessment/Plan  [place] Narcotic  database reviewed for this patient, and feel that the risk/benefit ratio today is favorable for proceeding with a prescription for controlled substance. No opiate rx in 6 months.  Procedure note. Using 0.25% bupivacaine, performed a dental block by injecting 0.5 cc at the gumline/affected tooth with complete resolution in pain. Patient tolerated procedure well.  Home with penicillin, Listerine salt water rinses, ibuprofen 600 mg with 1 g of Tylenol 3-4 times a day or may take 1-2 tabs of Norco instead of the Tylenol. Will provide a primary care referral which also has dental information on it.   Meds ordered this encounter  Medications  . HYDROcodone-acetaminophen (NORCO/VICODIN) 5-325 MG tablet    Sig: 1-2 tabs q 6hr prn pain    Dispense:  20 tablet    Refill:  0  . penicillin v potassium (VEETID) 500 MG tablet    Sig: Take 1 tablet (500 mg total) by mouth 4 (four) times daily.    Dispense:  28 tablet    Refill:  0  . ibuprofen (ADVIL,MOTRIN) 600 MG tablet    Sig: Take 1 tablet (600 mg total) by mouth every 6 (six) hours as needed.    Dispense:  30 tablet    Refill:  0    *This clinic note was created using [employer]. Therefore, there may be occasional mistakes despite careful proofreading.  ?   Melynda Ripple, MD 07/28/17 [phone]

## 2017-07-27 NOTE — ED Triage Notes (Signed)
Patient complains of dental pain that is a possible abscess that started 4 days ago. Patient reports that she does not have a dentist. Top left tooth with facial swelling.

## 2017-07-27 NOTE — Discharge Instructions (Signed)
penicillin, Listerine and salt water rinses, ibuprofen 600 mg with 1 g of Tylenol 3-4 times a day or may take 1-2 tabs of Norco instead of the Tylenol. Do not take both the Tylenol and Norco to benefit Tylenol in them and too much Tylenol can hurt your liver. Do not exceed 4 g of Tylenol from all sources in one day. Each tablet of Norco has 325 mg of Tylenol in it.  Here are clinics/ other resources who will see you if you do not have insurance. Some have certain criteria that you must meet. Call them and find out what they are:  [facility]: [address], [place], [place] [address] Phone: [phone] Hours: First and Third Saturdays of each Month, 9 a.m. - 1 p.m.  [facility]: [address] [place], [place], [place] [address] Phone: [phone] Hours: Tuesday, 4 p.m. - 8 p.m. Thursday, 1 p.m. - 8 p.m. Wednesday, 9 a.m. - [facility] [address], [place], [place] [address] Phone: [phone] Pharmacy Phone Number: [phone] Dental Phone Number: [phone] [place] Help: [phone]  Dental Hours: Monday - Thursday, 8 a.m. - 6 p.m.  [facility] [address] [place], [place] [address] Phone: [phone] Pharmacy Phone Number: [phone] [facility] Insurance Help: [phone]  [facility] [address] [place], [place] [address] Phone: [phone] Pharmacy Phone Number: [phone] [facility] Insurance Help: [phone]  [facility] [address] [place], [place] [address] Phone: [phone] [facility] Insurance Help: [phone]   [address] [place],  [address] Phone: [phone]  Go to www.goodrx.com to look up your medications. This will give you a list of where you can find your prescriptions at the most affordable prices. Or ask the pharmacist what the cash price is, or if they have any other discount programs  available to help make your medication more affordable. This can be less expensive than what you would pay with insurance.

## 2017-10-02 ENCOUNTER — Other Ambulatory Visit: Payer: Self-pay | Admitting: Family Medicine

## 2017-10-02 DIAGNOSIS — I10 Essential (primary) hypertension: Secondary | ICD-10-CM

## 2017-10-20 ENCOUNTER — Other Ambulatory Visit: Payer: Self-pay | Admitting: Family Medicine

## 2017-10-20 DIAGNOSIS — I10 Essential (primary) hypertension: Secondary | ICD-10-CM

## 2017-11-27 ENCOUNTER — Other Ambulatory Visit: Payer: Self-pay | Admitting: Family Medicine

## 2017-11-27 DIAGNOSIS — I10 Essential (primary) hypertension: Secondary | ICD-10-CM

## 2017-12-19 ENCOUNTER — Telehealth: Payer: Self-pay | Admitting: Family Medicine

## 2017-12-19 NOTE — Telephone Encounter (Signed)
Patient has appointment for March 1 for med refill. She will need refills for lisinopril-hydrochlorothiazide (PRINZIDE,ZESTORETIC) 20-25 MG tablet  Till then.

## 2017-12-21 ENCOUNTER — Other Ambulatory Visit: Payer: Self-pay

## 2017-12-21 DIAGNOSIS — I10 Essential (primary) hypertension: Secondary | ICD-10-CM

## 2017-12-21 MED ORDER — LISINOPRIL-HYDROCHLOROTHIAZIDE 20-25 MG PO TABS: 1.0000 | ORAL_TABLET | Freq: Every day | ORAL | 0 refills | Status: DC

## 2017-12-21 NOTE — Telephone Encounter (Signed)
This pt has not been seen since 2017- I'll send in 2 weeks- will need to see before 2 weeks is up. Which means she will need to move appt up. Please call

## 2017-12-21 NOTE — Telephone Encounter (Signed)
lvm to reschedule appt.

## 2018-01-12 ENCOUNTER — Ambulatory Visit: Payer: Commercial Managed Care - PPO | Admitting: Family Medicine

## 2018-01-12 ENCOUNTER — Encounter: Payer: Self-pay | Admitting: Family Medicine

## 2018-01-12 VITALS — BP 120/82 | HR 100 | Ht 61.0 in | Wt 137.0 lb

## 2018-01-12 DIAGNOSIS — J0100 Acute maxillary sinusitis, unspecified: Secondary | ICD-10-CM | POA: Diagnosis not present

## 2018-01-12 DIAGNOSIS — J449 Chronic obstructive pulmonary disease, unspecified: Secondary | ICD-10-CM | POA: Diagnosis not present

## 2018-01-12 DIAGNOSIS — I10 Essential (primary) hypertension: Secondary | ICD-10-CM | POA: Diagnosis not present

## 2018-01-12 MED ORDER — HYDROCHLOROTHIAZIDE 12.5 MG PO TABS: 12.5000 mg | ORAL_TABLET | Freq: Every day | ORAL | 1 refills | Status: DC

## 2018-01-12 MED ORDER — AMOXICILLIN 500 MG PO CAPS: 500.0000 mg | ORAL_CAPSULE | Freq: Three times a day (TID) | ORAL | 0 refills | Status: DC

## 2018-01-12 NOTE — Progress Notes (Signed)
Name: Tamara Olson   MRN: [id number]    DOB: Mar 27, 1966   Date:01/12/2018       Progress Note  Subjective  Chief Complaint  Chief Complaint  Patient presents with  . Hypertension    thinks B/p med is causing a dry cough. has been taking b/p med every other day for 1 year and cough isn't better.    Hypertension  This is a chronic problem. The current episode started more than 1 year ago. The problem has been gradually worsening since onset. The problem is uncontrolled. Pertinent negatives include no anxiety, blurred vision, chest pain, headaches, malaise/fatigue, neck pain, orthopnea, palpitations, peripheral edema, PND, shortness of breath or sweats. There are no associated agents to hypertension. Risk factors for coronary artery disease include dyslipidemia. Past treatments include ACE inhibitors and diuretics. The current treatment provides moderate improvement. There are no compliance problems.  There is no history of angina, kidney disease, CAD/MI, CVA, heart failure, left ventricular hypertrophy, PVD or retinopathy. There is no history of chronic renal disease, a hypertension causing med or renovascular disease.  Sinusitis  This is a new problem. The current episode started in the past 7 days. The problem has been gradually worsening since onset. There has been no fever. Associated symptoms include congestion and sinus pressure. Pertinent negatives include no chills, coughing, ear pain, headaches, neck pain, shortness of breath or sore throat. (Green nasal discharge /with blood) Past treatments include acetaminophen and oral decongestants. The treatment provided moderate relief.    No problem-specific Assessment & Plan notes found for this encounter.   Past Medical History:  Diagnosis Date  . Hypertension 2014  . Thyroid disease     Past Surgical History:  Procedure Laterality Date  . ABDOMINAL HYSTERECTOMY  2009  . APPENDECTOMY  1990's  . CESAREAN SECTION  1989  . DENTAL  SURGERY     pulled top teeth- has dentures  . TONSILLECTOMY  2010    Family History  Problem Relation Age of Onset  . Hypertension Father   . Heart disease Father   . Vision loss Father   . Hypertension Mother   . Vision loss Mother   . Hypothyroidism Sister   . Hypertension Sister   . Vision loss Sister     Social History   Socioeconomic History  . Marital status: Widowed    Spouse name: Not on file  . Number of children: Not on file  . Years of education: Not on file  . Highest education level: Not on file  Social Needs  . Financial resource strain: Not on file  . Food insecurity - worry: Not on file  . Food insecurity - inability: Not on file  . Transportation needs - medical: Not on file  . Transportation needs - non-medical: Not on file  Occupational History  . Not on file  Tobacco Use  . Smoking status: Current Every Day Smoker    Packs/day: 1.00    Years: 30.00    Pack years: 30.00    Types: Cigarettes  . Smokeless tobacco: Never Used  . Tobacco comment: Patient not ready to quit yet  Substance and Sexual Activity  . Alcohol use: Yes    Alcohol/week: 1.2 - 2.4 oz    Types: 2 - 4 Cans of beer per week    Comment: 2-4 q day  . Drug use: No  . Sexual activity: Not on file  Other Topics Concern  . Not on file  Social History Narrative  .  Not on file    No Known Allergies  Outpatient Medications Prior to Visit  Medication Sig Dispense Refill  . albuterol (PROAIR HFA) 108 (90 Base) MCG/ACT inhaler Inhale 2 puffs into the lungs every 6 (six) hours as needed for wheezing or shortness of breath. 1 Inhaler 6  . lisinopril-hydrochlorothiazide (PRINZIDE,ZESTORETIC) 20-25 MG tablet Take 1 tablet by mouth daily. 14 tablet 0  . HYDROcodone-acetaminophen (NORCO/VICODIN) 5-325 MG tablet 1-2 tabs q 6hr prn pain 20 tablet 0  . ibuprofen (ADVIL,MOTRIN) 600 MG tablet Take 1 tablet (600 mg total) by mouth every 6 (six) hours as needed. 30 tablet 0   No  facility-administered medications prior to visit.     Review of Systems  Constitutional: Negative for chills, fever, malaise/fatigue and weight loss.  HENT: Positive for congestion and sinus pressure. Negative for ear discharge, ear pain and sore throat.   Eyes: Negative for blurred vision.  Respiratory: Negative for cough, sputum production, shortness of breath and wheezing.   Cardiovascular: Negative for chest pain, palpitations, orthopnea, leg swelling and PND.  Gastrointestinal: Negative for abdominal pain, blood in stool, constipation, diarrhea, heartburn, melena and nausea.  Genitourinary: Negative for dysuria, frequency, hematuria and urgency.  Musculoskeletal: Negative for back pain, joint pain, myalgias and neck pain.  Skin: Negative for rash.  Neurological: Negative for dizziness, tingling, sensory change, focal weakness and headaches.  Endo/Heme/Allergies: Negative for environmental allergies and polydipsia. Does not bruise/bleed easily.  Psychiatric/Behavioral: Negative for depression and suicidal ideas. The patient is not nervous/anxious and does not have insomnia.      Objective  Vitals:   01/12/18 1547  BP: 120/82  Pulse: 100  Weight: 137 lb (62.1 kg)  Height: 5\' 1"  (1.549 m)    Physical Exam  Constitutional: She is well-developed, well-nourished, and in no distress. No distress.  HENT:  Head: Normocephalic and atraumatic.  Right Ear: External ear normal.  Left Ear: External ear normal.  Nose: Right sinus exhibits no maxillary sinus tenderness and no frontal sinus tenderness. Left sinus exhibits no maxillary sinus tenderness and no frontal sinus tenderness.  Mouth/Throat: Oropharynx is clear and moist.  Eyes: Conjunctivae and EOM are normal. Pupils are equal, round, and reactive to light. Right eye exhibits no discharge. Left eye exhibits no discharge.  Neck: Normal range of motion. Neck supple. No JVD present. No thyromegaly present.  Cardiovascular: Normal  rate, regular rhythm, normal heart sounds and intact distal pulses. Exam reveals no gallop and no friction rub.  No murmur heard. Pulmonary/Chest: Effort normal and breath sounds normal. She has no wheezes. She has no rales.  Abdominal: Soft. Bowel sounds are normal. She exhibits no mass. There is no tenderness. There is no guarding.  Musculoskeletal: Normal range of motion. She exhibits no edema.  Lymphadenopathy:    She has no cervical adenopathy.  Neurological: She is alert. She has normal reflexes.  Skin: Skin is warm and dry. She is not diaphoretic.  Psychiatric: Mood and affect normal.  Nursing note and vitals reviewed.     Assessment & Plan  Problem List Items Addressed This Visit      Cardiovascular and Mediastinum   Essential hypertension - Primary   Relevant Medications   hydrochlorothiazide (HYDRODIURIL) 12.5 MG tablet     Respiratory   COPD, mild (HCC)    Other Visit Diagnoses    Acute maxillary sinusitis, recurrence not specified       Relevant Medications   amoxicillin (AMOXIL) 500 MG capsule      Meds  ordered this encounter  Medications  . amoxicillin (AMOXIL) 500 MG capsule    Sig: Take 1 capsule (500 mg total) by mouth 3 (three) times daily.    Dispense:  30 capsule    Refill:  0  . hydrochlorothiazide (HYDRODIURIL) 12.5 MG tablet    Sig: Take 1 tablet (12.5 mg total) by mouth daily.    Dispense:  90 tablet    Refill:  1      Dr. Otilio Miu [facility]  01/12/18

## 2018-06-14 ENCOUNTER — Encounter: Payer: Commercial Managed Care - PPO | Admitting: Family Medicine

## 2018-06-18 ENCOUNTER — Encounter: Payer: Commercial Managed Care - PPO | Admitting: Family Medicine

## 2018-06-27 ENCOUNTER — Ambulatory Visit (INDEPENDENT_AMBULATORY_CARE_PROVIDER_SITE_OTHER): Payer: Commercial Managed Care - PPO | Admitting: Family Medicine

## 2018-06-27 ENCOUNTER — Encounter: Payer: Self-pay | Admitting: Family Medicine

## 2018-06-27 VITALS — BP 118/77 | HR 104 | Resp 16 | Ht 61.0 in | Wt 134.0 lb

## 2018-06-27 DIAGNOSIS — E039 Hypothyroidism, unspecified: Secondary | ICD-10-CM

## 2018-06-27 DIAGNOSIS — I10 Essential (primary) hypertension: Secondary | ICD-10-CM | POA: Diagnosis not present

## 2018-06-27 DIAGNOSIS — E782 Mixed hyperlipidemia: Secondary | ICD-10-CM

## 2018-06-27 DIAGNOSIS — Z1211 Encounter for screening for malignant neoplasm of colon: Secondary | ICD-10-CM

## 2018-06-27 DIAGNOSIS — Z0000 Encounter for general adult medical examination without abnormal findings: Secondary | ICD-10-CM

## 2018-06-27 DIAGNOSIS — D049 Carcinoma in situ of skin, unspecified: Secondary | ICD-10-CM

## 2018-06-27 DIAGNOSIS — J449 Chronic obstructive pulmonary disease, unspecified: Secondary | ICD-10-CM

## 2018-06-27 DIAGNOSIS — Z1239 Encounter for other screening for malignant neoplasm of breast: Secondary | ICD-10-CM

## 2018-06-27 DIAGNOSIS — N3941 Urge incontinence: Secondary | ICD-10-CM | POA: Diagnosis not present

## 2018-06-27 LAB — POCT URINALYSIS DIPSTICK
Bilirubin, UA: NEGATIVE
Blood, UA: NEGATIVE
Glucose, UA: NEGATIVE
Ketones, UA: NEGATIVE
LEUKOCYTES UA: NEGATIVE
NITRITE UA: NEGATIVE
PH UA: 6.5 (ref 5.0–8.0)
PROTEIN UA: NEGATIVE
Spec Grav, UA: 1.01 (ref 1.010–1.025)
UROBILINOGEN UA: 0.2 U/dL

## 2018-06-27 MED ORDER — HYDROCHLOROTHIAZIDE 12.5 MG PO TABS: 12.5000 mg | ORAL_TABLET | Freq: Every day | ORAL | 1 refills | Status: DC

## 2018-06-27 MED ORDER — ALBUTEROL SULFATE HFA 108 (90 BASE) MCG/ACT IN AERS: 2.0000 | INHALATION_SPRAY | Freq: Four times a day (QID) | RESPIRATORY_TRACT | 6 refills | Status: DC | PRN

## 2018-06-27 NOTE — Assessment & Plan Note (Signed)
Intermitant stable Continue albuterol inhaler 2 puffs w 6 hr

## 2018-06-27 NOTE — Assessment & Plan Note (Signed)
Hemoccult negative.  Patient desires to wait after holidays to do colonoscopy.

## 2018-06-27 NOTE — Progress Notes (Signed)
Name: Tamara Olson   MRN: [id number]    DOB: 25-Dec-1965   Date:06/27/2018       Progress Note  Subjective  Chief Complaint  Chief Complaint  Patient presents with  . Annual Exam    Patient is a 52 year old female who presents for a comprehensive physical exam. The patient reports the following problems: hypertensive medication and inhaler refilled. Health maintenance has been reviewed discussed colonoscopy and mammogram.  Hypertension  This is a chronic problem. The current episode started more than 1 year ago. The problem has been waxing and waning (depends on work) since onset. The problem is controlled. Pertinent negatives include no blurred vision, chest pain, headaches, malaise/fatigue, neck pain, palpitations, PND, shortness of breath or sweats. There are no known risk factors for coronary artery disease. Past treatments include diuretics. The current treatment provides moderate improvement. There is no history of angina, kidney disease, CAD/MI, CVA, heart failure, left ventricular hypertrophy, PVD or retinopathy. There is no history of chronic renal disease, a hypertension causing med or renovascular disease.  Asthma  She complains of wheezing. There is no cough, shortness of breath or sputum production. Primary symptoms comments: occ wheezing. This is a chronic problem. The current episode started more than 1 year ago. The problem occurs intermittently. The problem has been waxing and waning. Pertinent negatives include no chest pain, ear pain, fever, headaches, heartburn, malaise/fatigue, myalgias, PND, rhinorrhea, sneezing, sore throat, sweats or weight loss. Her symptoms are aggravated by pollen. Her past medical history is significant for asthma.  Urinary Frequency   This is a new problem. The current episode started more than 1 month ago. The problem occurs intermittently. The pain is moderate. There has been no fever. Associated symptoms include urgency. Pertinent negatives include  no chills, discharge, flank pain, frequency, hematuria, hesitancy, nausea, possible pregnancy, sweats or vomiting.    COPD, mild Intermitant stable Continue albuterol inhaler 2 puffs w 6 hr  Hypothyroidism Chronic Stable Will check tsh.and treat accordingly.  Essential hypertension Chronic Controlled Continue HCTZ 12.5 daily. Check renal panel.  Encounter for screening colonoscopy Hemoccult negative.  Patient desires to wait after holidays to do colonoscopy.   Past Medical History:  Diagnosis Date  . Hypertension 2014  . Thyroid disease     Past Surgical History:  Procedure Laterality Date  . ABDOMINAL HYSTERECTOMY  2009  . APPENDECTOMY  1990's  . CESAREAN SECTION  1989  . DENTAL SURGERY     pulled top teeth- has dentures  . TONSILLECTOMY  2010    Family History  Problem Relation Age of Onset  . Hypertension Father   . Heart disease Father   . Vision loss Father   . Hypertension Mother   . Vision loss Mother   . Hypothyroidism Sister   . Hypertension Sister   . Vision loss Sister     Social History   Socioeconomic History  . Marital status: Widowed    Spouse name: Not on file  . Number of children: Not on file  . Years of education: Not on file  . Highest education level: Not on file  Occupational History  . Not on file  Social Needs  . Financial resource strain: Not on file  . Food insecurity:    Worry: Not on file    Inability: Not on file  . Transportation needs:    Medical: Not on file    Non-medical: Not on file  Tobacco Use  . Smoking status: Current Every Day  Smoker    Packs/day: 1.00    Years: 30.00    Pack years: 30.00    Types: Cigarettes  . Smokeless tobacco: Never Used  . Tobacco comment: Patient not ready to quit yet  Substance and Sexual Activity  . Alcohol use: Yes    Alcohol/week: 2.0 - 4.0 standard drinks    Types: 2 - 4 Cans of beer per week    Comment: 2-4 q day  . Drug use: No  . Sexual activity: Not on file  Lifestyle   . Physical activity:    Days per week: Not on file    Minutes per session: Not on file  . Stress: Not on file  Relationships  . Social connections:    Talks on phone: Not on file    Gets together: Not on file    Attends religious service: Not on file    Active member of club or organization: Not on file    Attends meetings of clubs or organizations: Not on file    Relationship status: Not on file  . Intimate partner violence:    Fear of current or ex partner: Not on file    Emotionally abused: Not on file    Physically abused: Not on file    Forced sexual activity: Not on file  Other Topics Concern  . Not on file  Social History Narrative  . Not on file    No Known Allergies  Outpatient Medications Prior to Visit  Medication Sig Dispense Refill  . albuterol (PROAIR HFA) 108 (90 Base) MCG/ACT inhaler Inhale 2 puffs into the lungs every 6 (six) hours as needed for wheezing or shortness of breath. 1 Inhaler 6  . amoxicillin (AMOXIL) 500 MG capsule Take 1 capsule (500 mg total) by mouth 3 (three) times daily. 30 capsule 0  . hydrochlorothiazide (HYDRODIURIL) 12.5 MG tablet Take 1 tablet (12.5 mg total) by mouth daily. 90 tablet 1   No facility-administered medications prior to visit.     Review of Systems  Constitutional: Negative for chills, fever, malaise/fatigue and weight loss.  HENT: Negative for ear discharge, ear pain, rhinorrhea, sneezing and sore throat.   Eyes: Negative for blurred vision.  Respiratory: Positive for wheezing. Negative for cough, sputum production and shortness of breath.   Cardiovascular: Negative for chest pain, palpitations, leg swelling and PND.  Gastrointestinal: Negative for abdominal pain, blood in stool, constipation, diarrhea, heartburn, melena, nausea and vomiting.  Genitourinary: Positive for urgency. Negative for dysuria, flank pain, frequency, hematuria and hesitancy.  Musculoskeletal: Negative for back pain, joint pain, myalgias and neck  pain.  Skin: Negative for rash.  Neurological: Negative for dizziness, tingling, sensory change, focal weakness and headaches.  Endo/Heme/Allergies: Negative for environmental allergies and polydipsia. Does not bruise/bleed easily.  Psychiatric/Behavioral: Negative for depression and suicidal ideas. The patient is not nervous/anxious and does not have insomnia.      Objective  Vitals:   06/27/18 0824  BP: 118/77  Pulse: (!) 104  Resp: 16  SpO2: 97%  Weight: 134 lb (60.8 kg)  Height: 5\' 1"  (1.549 m)    Physical Exam  Constitutional: She is oriented to person, place, and time. She appears well-developed and well-nourished.  HENT:  Head: Normocephalic and atraumatic.  Right Ear: Hearing, external ear and ear canal normal. A middle ear effusion is present.  Left Ear: Hearing, external ear and ear canal normal. A middle ear effusion is present.  Nose: Nose normal.  Mouth/Throat: Oropharynx is clear and moist.  No oropharyngeal exudate, posterior oropharyngeal edema or posterior oropharyngeal erythema.  Eyes: Pupils are equal, round, and reactive to light. Conjunctivae, EOM and lids are normal. Lids are everted and swept, no foreign bodies found. Left eye exhibits no hordeolum. No foreign body present in the left eye. Right conjunctiva is not injected. Left conjunctiva is not injected. No scleral icterus.  Fundoscopic exam:      The right eye shows no AV nicking.       The left eye shows no AV nicking.  Neck: Trachea normal, normal range of motion and full passive range of motion without pain. Neck supple. Normal carotid pulses, no hepatojugular reflux and no JVD present. Carotid bruit is not present. No tracheal deviation present. No thyroid mass and no thyromegaly present.  Cardiovascular: Normal rate, regular rhythm, S1 normal, S2 normal, normal heart sounds and intact distal pulses. PMI is not displaced. Exam reveals no gallop, no S3, no S4, no distant heart sounds and no friction rub.   No murmur heard.  No systolic murmur is present.  No diastolic murmur is present. Pulses:      Carotid pulses are 2+ on the right side, and 2+ on the left side.      Radial pulses are 2+ on the right side, and 2+ on the left side.       Femoral pulses are 2+ on the right side, and 2+ on the left side.      Popliteal pulses are 2+ on the right side, and 2+ on the left side.       Dorsalis pedis pulses are 2+ on the right side, and 2+ on the left side.       Posterior tibial pulses are 2+ on the right side, and 2+ on the left side.  Pulmonary/Chest: Effort normal and breath sounds normal. No stridor. No respiratory distress. She has no decreased breath sounds. She has no wheezes. She has no rales. Right breast exhibits no inverted nipple, no mass, no nipple discharge, no skin change and no tenderness. Left breast exhibits no inverted nipple, no mass, no nipple discharge, no skin change and no tenderness. No breast swelling, tenderness, discharge or bleeding. Breasts are symmetrical.  Abdominal: Soft. Normal appearance and bowel sounds are normal. She exhibits no mass. There is no hepatosplenomegaly. There is no tenderness. There is no rebound and no guarding.  Genitourinary: Rectum normal. Rectal exam shows guaiac negative stool. No breast swelling, tenderness, discharge or bleeding.  Musculoskeletal: Normal range of motion. She exhibits no edema or tenderness.       Lumbar back: She exhibits no spasm.  Lymphadenopathy:       Head (right side): No submental and no submandibular adenopathy present.       Head (left side): No submental and no submandibular adenopathy present.  Neurological: She is alert and oriented to person, place, and time. She has normal strength. She displays normal reflexes. No cranial nerve deficit or sensory deficit.  Reflex Scores:      Tricep reflexes are 2+ on the right side and 2+ on the left side.      Bicep reflexes are 2+ on the right side and 2+ on the left side.       Brachioradialis reflexes are 2+ on the right side and 2+ on the left side.      Patellar reflexes are 2+ on the right side and 2+ on the left side.      Achilles reflexes are 2+ on the  right side and 2+ on the left side. Skin: Skin is warm, dry and intact. No rash noted.  Psychiatric: She has a normal mood and affect. Her mood appears not anxious. She does not exhibit a depressed mood.  Nursing note and vitals reviewed.     Assessment & Plan  Problem List Items Addressed This Visit      Cardiovascular and Mediastinum   Essential hypertension    Chronic Controlled Continue HCTZ 12.5 daily. Check renal panel.      Relevant Medications   hydrochlorothiazide (HYDRODIURIL) 12.5 MG tablet   Other Relevant Orders   Renal Function Panel     Respiratory   COPD, mild (HCC)    Intermitant stable Continue albuterol inhaler 2 puffs w 6 hr      Relevant Medications   albuterol (PROAIR HFA) 108 (90 Base) MCG/ACT inhaler     Endocrine   Hypothyroidism    Chronic Stable Will check tsh.and treat accordingly.      Relevant Orders   TSH     Other   Encounter for screening colonoscopy    Hemoccult negative.  Patient desires to wait after holidays to do colonoscopy.       Other Visit Diagnoses    Annual physical exam    -  Primary   No subjective/objective concerns. Maintenance labs obtained.    Basal cell carcinoma (BCC) in situ of skin       Suspect basal cell .Will refer to dermatology for evaluation and treatment.   Relevant Orders   Ambulatory referral to Dermatology   Mixed hyperlipidemia       Chronic Diet controlled. Check lipid panel and treat accordingly.   Relevant Medications   hydrochlorothiazide (HYDRODIURIL) 12.5 MG tablet   Other Relevant Orders   Lipid panel   Screening breast examination       Schedule for screening 3D mammogram.   Relevant Orders   MM 3D SCREEN BREAST BILATERAL   Urge incontinence       Note to employment needs increased access to  restroom facilities   Relevant Orders   POCT urinalysis dipstick (Completed)      Meds ordered this encounter  Medications  . albuterol (PROAIR HFA) 108 (90 Base) MCG/ACT inhaler    Sig: Inhale 2 puffs into the lungs every 6 (six) hours as needed for wheezing or shortness of breath.    Dispense:  1 Inhaler    Refill:  6  . hydrochlorothiazide (HYDRODIURIL) 12.5 MG tablet    Sig: Take 1 tablet (12.5 mg total) by mouth daily.    Dispense:  90 tablet    Refill:  1  Tamara Olson is a 52 y.o. female who presents today for her Complete Annual Exam. She feels well. She reports exercising rarely. She reports she is sleeping well.  Immunizations are reviewed and recommendations provided.   Age appropriate screening tests are discussed. Counseling given for risk factor reduction interventions.   Dr. Mac[facility]  06/27/18

## 2018-06-27 NOTE — Assessment & Plan Note (Signed)
Chronic Controlled Continue HCTZ 12.5 daily. Check renal panel.

## 2018-06-27 NOTE — Assessment & Plan Note (Signed)
Chronic Stable Will check tsh.and treat accordingly.

## 2018-06-28 LAB — LIPID PANEL
CHOL/HDL RATIO: 3.1 ratio (ref 0.0–4.4)
Cholesterol, Total: 289 mg/dL — ABNORMAL HIGH (ref 100–199)
HDL: 92 mg/dL (ref >39)
LDL Calculated: 176 mg/dL — ABNORMAL HIGH (ref 0–99)
Triglycerides: 107 mg/dL (ref 0–149)
VLDL CHOLESTEROL CAL: 21 mg/dL (ref 5–40)

## 2018-06-28 LAB — RENAL FUNCTION PANEL
ALBUMIN: 4.4 g/dL (ref 3.5–5.5)
BUN/Creatinine Ratio: 6 — ABNORMAL LOW (ref 9–23)
BUN: 6 mg/dL (ref 6–24)
CHLORIDE: 103 mmol/L (ref 96–106)
CO2: 19 mmol/L — AB (ref 20–29)
Calcium: 10.5 mg/dL — ABNORMAL HIGH (ref 8.7–10.2)
Creatinine, Ser: 0.93 mg/dL (ref 0.57–1.00)
GFR, EST AFRICAN AMERICAN: 81 mL/min/{1.73_m2} (ref >59)
GFR, EST NON AFRICAN AMERICAN: 70 mL/min/{1.73_m2} (ref >59)
GLUCOSE: 95 mg/dL (ref 65–99)
Phosphorus: 4.4 mg/dL (ref 2.5–4.5)
Potassium: 4.8 mmol/L (ref 3.5–5.2)
Sodium: 143 mmol/L (ref 134–144)

## 2018-06-28 LAB — TSH: TSH: 3.94 u[IU]/mL (ref 0.450–4.500)

## 2018-07-03 ENCOUNTER — Other Ambulatory Visit: Payer: Self-pay | Admitting: Family Medicine

## 2018-07-03 DIAGNOSIS — I10 Essential (primary) hypertension: Secondary | ICD-10-CM

## 2018-07-09 ENCOUNTER — Inpatient Hospital Stay: Admission: RE | Admit: 2018-07-09 | Payer: Commercial Managed Care - PPO | Source: Ambulatory Visit

## 2018-11-08 ENCOUNTER — Inpatient Hospital Stay: Admission: RE | Admit: 2018-11-08 | Payer: Commercial Managed Care - PPO | Source: Ambulatory Visit

## 2019-04-11 ENCOUNTER — Other Ambulatory Visit: Payer: Self-pay | Admitting: Family Medicine

## 2019-04-11 DIAGNOSIS — I10 Essential (primary) hypertension: Secondary | ICD-10-CM

## 2019-06-28 ENCOUNTER — Encounter: Payer: Commercial Managed Care - PPO | Admitting: Family Medicine

## 2019-07-04 ENCOUNTER — Encounter: Payer: Self-pay | Admitting: Family Medicine

## 2019-07-04 ENCOUNTER — Other Ambulatory Visit: Payer: Self-pay

## 2019-07-04 ENCOUNTER — Ambulatory Visit
Admission: RE | Admit: 2019-07-04 | Discharge: 2019-07-04 | Disposition: A | Discharge status: Home / Self Care | Payer: Commercial Managed Care - PPO | Source: Ambulatory Visit | Attending: Family Medicine | Admitting: Family Medicine

## 2019-07-04 ENCOUNTER — Ambulatory Visit (INDEPENDENT_AMBULATORY_CARE_PROVIDER_SITE_OTHER): Payer: Commercial Managed Care - PPO | Admitting: Family Medicine

## 2019-07-04 VITALS — BP 140/100 | HR 80 | Ht 61.0 in | Wt 153.0 lb

## 2019-07-04 DIAGNOSIS — E039 Hypothyroidism, unspecified: Secondary | ICD-10-CM

## 2019-07-04 DIAGNOSIS — Z1231 Encounter for screening mammogram for malignant neoplasm of breast: Secondary | ICD-10-CM

## 2019-07-04 DIAGNOSIS — E782 Mixed hyperlipidemia: Secondary | ICD-10-CM

## 2019-07-04 DIAGNOSIS — D049 Carcinoma in situ of skin, unspecified: Secondary | ICD-10-CM

## 2019-07-04 DIAGNOSIS — Z0000 Encounter for general adult medical examination without abnormal findings: Secondary | ICD-10-CM

## 2019-07-04 DIAGNOSIS — I10 Essential (primary) hypertension: Secondary | ICD-10-CM | POA: Diagnosis not present

## 2019-07-04 HISTORY — DX: Malignant (primary) neoplasm, unspecified: C80.1

## 2019-07-04 NOTE — Patient Instructions (Signed)

## 2019-07-04 NOTE — Progress Notes (Signed)
Date:  07/04/2019   Name:  Tamara Olson   DOB:  [birth year]   MRN:  [id number]   Chief Complaint: Annual Exam (no pap/ needs mammo) and Hypertension  Patient is a 53 year old female who presents for a comprehensive physical exam. The patient reports the following problems: medication refill. Health maintenance has been reviewed up to date  Hypertension This is a chronic problem. The current episode started more than 1 year ago. The problem has been gradually improving since onset. The problem is controlled. Pertinent negatives include no anxiety, blurred vision, chest pain, headaches, malaise/fatigue, neck pain, orthopnea, palpitations, peripheral edema, PND, shortness of breath or sweats. There are no associated agents to hypertension. There are no known risk factors for coronary artery disease. Past treatments include diuretics. There are no compliance problems.  There is no history of angina, kidney disease, CAD/MI, CVA, heart failure, left ventricular hypertrophy, PVD or retinopathy. There is no history of chronic renal disease, a hypertension causing med or renovascular disease.    Review of Systems  Constitutional: Negative for chills, fever and malaise/fatigue.  HENT: Negative for drooling, ear discharge, ear pain and sore throat.   Eyes: Negative for blurred vision.  Respiratory: Negative for cough, shortness of breath and wheezing.   Cardiovascular: Negative for chest pain, palpitations, orthopnea, leg swelling and PND.  Gastrointestinal: Negative for abdominal pain, blood in stool, constipation, diarrhea and nausea.  Endocrine: Negative for polydipsia.  Genitourinary: Negative for dysuria, frequency, hematuria and urgency.  Musculoskeletal: Negative for back pain, myalgias and neck pain.  Skin: Negative for rash.  Allergic/Immunologic: Negative for environmental allergies.  Neurological: Negative for dizziness and headaches.  Hematological: Does not bruise/bleed easily.   Psychiatric/Behavioral: Negative for suicidal ideas. The patient is not nervous/anxious.     Patient Active Problem List   Diagnosis Date Noted  . Encounter for screening colonoscopy 07/28/2015  . Vitamin D deficiency 06/02/2015  . Hypothyroidism 06/02/2015  . Overweight 06/01/2015  . Essential hypertension 06/01/2015  . Smoker 06/01/2015  . 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 Intermittent low back pain 06/01/2015    No Known Allergies  Past Surgical History:  Procedure Laterality Date  . ABDOMINAL HYSTERECTOMY  2009  . APPENDECTOMY  1990's  . CESAREAN SECTION  1989  . DENTAL SURGERY     pulled top teeth- has dentures  . TONSILLECTOMY  2010    Social History   Tobacco Use  . Smoking status: Current Every Day Smoker    Packs/day: 1.00    Years: 30.00    Pack years: 30.00    Types: Cigarettes  . Smokeless tobacco: Never Used  . Tobacco comment: Patient not ready to quit yet  Substance Use Topics  . Alcohol use: Yes    Alcohol/week: 2.0 - 4.0 standard drinks    Types: 2 - 4 Cans of beer per week    Comment: 2-4 q day  . Drug use: No     Medication list has been reviewed and updated.  Current Meds  Medication Sig  . albuterol (PROAIR HFA) 108 (90 Base) MCG/ACT inhaler Inhale 2 puffs into the lungs every 6 (six) hours as needed for wheezing or shortness of breath.  . hydrochlorothiazide (HYDRODIURIL) 12.5 MG tablet TAKE 1 TABLET BY MOUTH EVERY DAY    PHQ 2/9 Scores 07/04/2019 06/27/2018 01/12/2018 01/01/2016  PHQ - 2 Score 0 0 0 0  PHQ- 9 Score 0 0 0 -    BP Readings from Last 3  Encounters:  07/04/19 (!) 140/100  06/27/18 118/77  01/12/18 120/82    Physical Exam Vitals signs and nursing note reviewed.  Constitutional:      Appearance: Normal appearance. She is well-developed and overweight.  HENT:     Head: Normocephalic.     Jaw: There is normal jaw occlusion.     Right Ear: Tympanic membrane, ear canal and external ear normal.     Left Ear: Tympanic  membrane, ear canal and external ear normal.     Nose: Nose normal.     Mouth/Throat:     Lips: Pink.     Mouth: Mucous membranes are moist.     Dentition: Normal dentition.     Tongue: No lesions.  Eyes:     General: Lids are normal. Vision grossly intact. Gaze aligned appropriately. No visual field deficit or scleral icterus.       Left eye: No foreign body or hordeolum.     Extraocular Movements: Extraocular movements intact.     Conjunctiva/sclera: Conjunctivae normal.     Right eye: Right conjunctiva is not injected.     Left eye: Left conjunctiva is not injected.     Pupils: Pupils are equal, round, and reactive to light.     Funduscopic exam:    Right eye: No AV nicking.        Left eye: No AV nicking.  Neck:     Musculoskeletal: Full passive range of motion without pain, normal range of motion and neck supple.     Thyroid: No thyroid mass, thyromegaly or thyroid tenderness.     Vascular: Normal carotid pulses. No carotid bruit, hepatojugular reflux or JVD.     Trachea: Trachea and phonation normal. No tracheal deviation.  Cardiovascular:     Rate and Rhythm: Normal rate and regular rhythm.  No extrasystoles are present.    Chest Wall: PMI is not displaced.     Pulses: Normal pulses.          Carotid pulses are 2+ on the right side and 2+ on the left side.      Radial pulses are 2+ on the right side and 2+ on the left side.       Femoral pulses are 2+ on the right side and 2+ on the left side.      Popliteal pulses are 2+ on the right side and 2+ on the left side.       Dorsalis pedis pulses are 2+ on the right side and 2+ on the left side.       Posterior tibial pulses are 2+ on the right side and 2+ on the left side.     Heart sounds: Normal heart sounds, S1 normal and S2 normal. No murmur. No systolic murmur. No diastolic murmur. No friction rub. No gallop. No S3 or S4 sounds.   Pulmonary:     Effort: Pulmonary effort is normal. No respiratory distress.     Breath  sounds: Normal breath sounds and air entry. No decreased air movement. No decreased breath sounds, wheezing, rhonchi or rales.  Chest:     Chest wall: No mass.     Breasts: Breasts are symmetrical.        Right: Normal. No swelling, bleeding, inverted nipple, mass, nipple discharge, skin change or tenderness.        Left: Normal. No swelling, bleeding, inverted nipple, mass, nipple discharge, skin change or tenderness.  Abdominal:     General: Bowel sounds are normal.  Palpations: Abdomen is soft. There is no mass.     Tenderness: There is no abdominal tenderness. There is no guarding or rebound.     Hernia: No hernia is present.  Genitourinary:    Rectum: Normal. Guaiac result negative. No mass.  Musculoskeletal: Normal range of motion.        General: No tenderness.     Right lower leg: No edema.     Left lower leg: No edema.  Lymphadenopathy:     Head:     Right side of head: No submental or submandibular adenopathy.     Left side of head: No submental or submandibular adenopathy.     Cervical: No cervical adenopathy.     Right cervical: No superficial, deep or posterior cervical adenopathy.    Left cervical: No superficial, deep or posterior cervical adenopathy.     Upper Body:     Right upper body: No supraclavicular or axillary adenopathy.     Left upper body: No supraclavicular or axillary adenopathy.  Skin:    General: Skin is warm.     Capillary Refill: Capillary refill takes less than 2 seconds.     Findings: No rash.     Comments: Basal cell right temopal  Neurological:     General: No focal deficit present.     Mental Status: She is alert and oriented to person, place, and time.     Cranial Nerves: Cranial nerves are intact. No cranial nerve deficit, dysarthria or facial asymmetry.     Sensory: Sensation is intact.     Deep Tendon Reflexes: Reflexes normal.     Reflex Scores:      Tricep reflexes are 2+ on the right side and 2+ on the left side.      Bicep  reflexes are 2+ on the right side and 2+ on the left side.      Brachioradialis reflexes are 2+ on the right side and 2+ on the left side.      Patellar reflexes are 2+ on the right side and 2+ on the left side.      Achilles reflexes are 2+ on the right side and 2+ on the left side. Psychiatric:        Mood and Affect: Mood is not anxious or depressed.        Behavior: Behavior is cooperative.     Wt Readings from Last 3 Encounters:  07/04/19 153 lb (69.4 kg)  06/27/18 134 lb (60.8 kg)  01/12/18 137 lb (62.1 kg)    BP (!) 140/100   Pulse 80   Ht 5\' 1"  (1.549 m)   Wt 153 lb (69.4 kg)   BMI 28.91 kg/m   Assessment and Plan: 1. Breast cancer screening by mammogram Manual breast exam was done on patient which did not note any palpable mass.  Will refer for 3D breast mammogram in the future. - MM 3D SCREEN BREAST BILATERAL; Future  2. Annual physical exam No subjective/objective concerns noted during the history and physical exam.  Patient's chart was reviewed for previous visit and pertinent laboratory and imaging.Tamara Olson is a 53 y.o. female who presents today for her Complete Annual Exam. She feels well. She reports exercising intermitantly. She reports she is sleeping well. Immunizations are reviewed and recommendations provided.   Age appropriate screening tests are discussed. Counseling given for risk factor reduction interventions.  Will obtain necessary labs such as lipid panel renal function and TSH.  3. Essential hypertension Chronic.  Controlled.  Patient's blood pressure was elevated this morning at the exam but patient had not received her medication.  Patient will resume her HCTZ and we will recheck it in 6 weeks on medication in the meantime we will check a renal function panel. - Renal Function Panel  4. 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  5. Mixed hyperlipidemia Chronic.  Controlled.  Patient is mostly controlling with diet will check lipid panel with ratio. - Lipid Panel With LDL/HDL Ratio  6. Hypothyroidism, unspecified type Patient used to be on medication when she had pregnancy and has not had a recheck in the past although she has not been experiencing significant hypothyroid symptomatology we will check a TSH to evaluate. - TSH

## 2019-07-05 LAB — RENAL FUNCTION PANEL
Albumin: 4.1 g/dL (ref 3.8–4.9)
BUN/Creatinine Ratio: 18 (ref 9–23)
BUN: 18 mg/dL (ref 6–24)
CO2: 21 mmol/L (ref 20–29)
Calcium: 9.7 mg/dL (ref 8.7–10.2)
Chloride: 104 mmol/L (ref 96–106)
Creatinine, Ser: 1.02 mg/dL — ABNORMAL HIGH (ref 0.57–1.00)
GFR calc Af Amer: 72 mL/min/{1.73_m2} (ref >59)
GFR calc non Af Amer: 63 mL/min/{1.73_m2} (ref >59)
Glucose: 94 mg/dL (ref 65–99)
Phosphorus: 3.7 mg/dL (ref 3.0–4.3)
Potassium: 3.9 mmol/L (ref 3.5–5.2)
Sodium: 141 mmol/L (ref 134–144)

## 2019-07-05 LAB — LIPID PANEL WITH LDL/HDL RATIO
Cholesterol, Total: 227 mg/dL — ABNORMAL HIGH (ref 100–199)
HDL: 75 mg/dL (ref >39)
LDL Calculated: 135 mg/dL — ABNORMAL HIGH (ref 0–99)
LDl/HDL Ratio: 1.8 ratio (ref 0.0–3.2)
Triglycerides: 84 mg/dL (ref 0–149)
VLDL Cholesterol Cal: 17 mg/dL (ref 5–40)

## 2019-07-05 LAB — TSH: TSH: 2.28 u[IU]/mL (ref 0.450–4.500)

## 2019-07-16 ENCOUNTER — Other Ambulatory Visit: Payer: Self-pay | Admitting: Family Medicine

## 2019-07-16 DIAGNOSIS — R928 Other abnormal and inconclusive findings on diagnostic imaging of breast: Secondary | ICD-10-CM

## 2019-08-14 ENCOUNTER — Ambulatory Visit
Admission: RE | Admit: 2019-08-14 | Discharge: 2019-08-14 | Disposition: A | Discharge status: Home / Self Care | Payer: Commercial Managed Care - PPO | Source: Ambulatory Visit | Attending: Family Medicine | Admitting: Family Medicine

## 2019-08-14 DIAGNOSIS — R928 Other abnormal and inconclusive findings on diagnostic imaging of breast: Secondary | ICD-10-CM | POA: Diagnosis present

## 2019-08-19 ENCOUNTER — Other Ambulatory Visit: Payer: Self-pay

## 2019-08-19 ENCOUNTER — Encounter: Payer: Self-pay | Admitting: Family Medicine

## 2019-08-19 ENCOUNTER — Ambulatory Visit: Payer: Commercial Managed Care - PPO | Admitting: Family Medicine

## 2019-08-19 VITALS — BP 136/89 | HR 91 | Resp 16 | Ht 61.0 in | Wt 156.0 lb

## 2019-08-19 DIAGNOSIS — I10 Essential (primary) hypertension: Secondary | ICD-10-CM | POA: Diagnosis not present

## 2019-08-19 MED ORDER — HYDROCHLOROTHIAZIDE 12.5 MG PO TABS: 12.5000 mg | ORAL_TABLET | Freq: Every day | ORAL | 0 refills | Status: DC

## 2019-08-19 NOTE — Patient Instructions (Signed)

## 2019-08-19 NOTE — Progress Notes (Signed)
Date:  08/19/2019   Name:  Tamara Olson   DOB:  [birth year]   MRN:  [id number]   Chief Complaint: Hypertension  Hypertension This is a chronic problem. The current episode started more than 1 year ago. The problem has been waxing and waning since onset. The problem is uncontrolled. Pertinent negatives include no anxiety, blurred vision, chest pain, headaches, malaise/fatigue, neck pain, orthopnea, palpitations, peripheral edema, PND, shortness of breath or sweats. There are no associated agents to hypertension. Risk factors for coronary artery disease include smoking/tobacco exposure.    Review of Systems  Constitutional: Negative.  Negative for chills, fatigue, fever, malaise/fatigue and unexpected weight change.  HENT: Negative for congestion, drooling, ear discharge, ear pain, rhinorrhea, sinus pressure, sneezing and sore throat.   Eyes: Negative for blurred vision, photophobia, pain, discharge, redness and itching.  Respiratory: Negative for cough, shortness of breath, wheezing and stridor.   Cardiovascular: Negative for chest pain, palpitations, orthopnea, leg swelling and PND.  Gastrointestinal: Negative for abdominal pain, blood in stool, constipation, diarrhea, nausea and vomiting.  Endocrine: Negative for cold intolerance, heat intolerance, polydipsia, polyphagia and polyuria.  Genitourinary: Negative for dysuria, flank pain, frequency, hematuria, menstrual problem, pelvic pain, urgency, vaginal bleeding and vaginal discharge.  Musculoskeletal: Negative for arthralgias, back pain, myalgias and neck pain.  Skin: Negative for rash.  Allergic/Immunologic: Negative for environmental allergies and food allergies.  Neurological: Negative for dizziness, weakness, light-headedness, numbness and headaches.  Hematological: Negative for adenopathy. Does not bruise/bleed easily.  Psychiatric/Behavioral: Negative for dysphoric mood and suicidal ideas. The patient is not nervous/anxious.     Patient Active Problem List   Diagnosis Date Noted  . Encounter for screening colonoscopy 07/28/2015  . Vitamin D deficiency 06/02/2015  . Hypothyroidism 06/02/2015  . Overweight 06/01/2015  . Essential hypertension 06/01/2015  . Smoker 06/01/2015  . 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 Intermittent low back pain 06/01/2015    No Known Allergies  Past Surgical History:  Procedure Laterality Date  . ABDOMINAL HYSTERECTOMY  2009  . APPENDECTOMY  1990's  . CESAREAN SECTION  1989  . DENTAL SURGERY     pulled top teeth- has dentures  . TONSILLECTOMY  2010    Social History   Tobacco Use  . Smoking status: Current Every Day Smoker    Packs/day: 1.00    Years: 30.00    Pack years: 30.00    Types: Cigarettes  . Smokeless tobacco: Never Used  . Tobacco comment: Patient not ready to quit yet  Substance Use Topics  . Alcohol use: Yes    Alcohol/week: 2.0 - 4.0 standard drinks    Types: 2 - 4 Cans of beer per week    Comment: 2-4 q day  . Drug use: No     Medication list has been reviewed and updated.  Current Meds  Medication Sig  . albuterol (PROAIR HFA) 108 (90 Base) MCG/ACT inhaler Inhale 2 puffs into the lungs every 6 (six) hours as needed for wheezing or shortness of breath.  . hydrochlorothiazide (HYDRODIURIL) 12.5 MG tablet TAKE 1 TABLET BY MOUTH EVERY DAY    PHQ 2/9 Scores 08/19/2019 07/04/2019 06/27/2018 01/12/2018  PHQ - 2 Score 0 0 0 0  PHQ- 9 Score 0 0 0 0    BP Readings from Last 3 Encounters:  08/19/19 140/90  07/04/19 (!) 140/100  06/27/18 118/77    Physical Exam  Wt Readings from Last 3 Encounters:  08/19/19 156 lb (70.8 kg)  07/04/19 153 lb (  69.4 kg)  06/27/18 134 lb (60.8 kg)    BP 140/90   Pulse 91   Resp 16   Ht 5\' 1"  (1.549 m)   Wt 156 lb (70.8 kg)   SpO2 96%   BMI 29.48 kg/m   Assessment and Plan:  1. Essential hypertension Patient returns today for blood pressure check which is somewhat elevated on the initial reading but on repeat  is within normal range.  Continue hydrochlorothiazide 12.5 mg once a day.  Will recheck in 12 weeks which next step is to maybe add losartan. - hydrochlorothiazide (HYDRODIURIL) 12.5 MG tablet; Take 1 tablet (12.5 mg total) by mouth daily.  Dispense: 90 tablet; Refill: 0

## 2019-11-05 ENCOUNTER — Ambulatory Visit: Payer: Commercial Managed Care - PPO | Admitting: Family Medicine

## 2019-11-18 ENCOUNTER — Other Ambulatory Visit: Payer: Self-pay | Admitting: Family Medicine

## 2019-11-18 DIAGNOSIS — I10 Essential (primary) hypertension: Secondary | ICD-10-CM

## 2019-12-02 ENCOUNTER — Other Ambulatory Visit: Payer: Self-pay | Admitting: Family Medicine

## 2019-12-02 DIAGNOSIS — I10 Essential (primary) hypertension: Secondary | ICD-10-CM

## 2019-12-17 ENCOUNTER — Ambulatory Visit: Payer: Commercial Managed Care - PPO | Admitting: Family Medicine

## 2019-12-17 ENCOUNTER — Other Ambulatory Visit: Payer: Self-pay

## 2019-12-17 ENCOUNTER — Encounter: Payer: Self-pay | Admitting: Family Medicine

## 2019-12-17 VITALS — BP 130/80 | HR 68 | Ht 61.0 in | Wt 166.0 lb

## 2019-12-17 DIAGNOSIS — J449 Chronic obstructive pulmonary disease, unspecified: Secondary | ICD-10-CM

## 2019-12-17 DIAGNOSIS — Z23 Encounter for immunization: Secondary | ICD-10-CM | POA: Diagnosis not present

## 2019-12-17 DIAGNOSIS — F17200 Nicotine dependence, unspecified, uncomplicated: Secondary | ICD-10-CM

## 2019-12-17 DIAGNOSIS — I10 Essential (primary) hypertension: Secondary | ICD-10-CM | POA: Diagnosis not present

## 2019-12-17 DIAGNOSIS — E782 Mixed hyperlipidemia: Secondary | ICD-10-CM

## 2019-12-17 MED ORDER — ALBUTEROL SULFATE HFA 108 (90 BASE) MCG/ACT IN AERS: 2.0000 | INHALATION_SPRAY | Freq: Four times a day (QID) | RESPIRATORY_TRACT | 11 refills | Status: DC | PRN

## 2019-12-17 MED ORDER — HYDROCHLOROTHIAZIDE 12.5 MG PO TABS: 12.5000 mg | ORAL_TABLET | Freq: Every day | ORAL | 1 refills | Status: DC

## 2019-12-17 NOTE — Progress Notes (Signed)
Date:  12/17/2019   Name:  Tamara Olson   DOB:  [birth year]   MRN:  [id number]   Chief Complaint: Hypertension and Asthma (uses inhaler as needed)  Hypertension This is a chronic problem. The current episode started more than 1 year ago. The problem has been gradually improving since onset. The problem is controlled. Pertinent negatives include no anxiety, blurred vision, chest pain, headaches, malaise/fatigue, neck pain, orthopnea, palpitations, peripheral edema, PND, shortness of breath or sweats. There are no associated agents to hypertension. Past treatments include ACE inhibitors. The current treatment provides moderate improvement. There are no compliance problems.  There is no history of angina, kidney disease, CAD/MI, CVA, heart failure, left ventricular hypertrophy, PVD or retinopathy. There is no history of chronic renal disease, a hypertension causing med or renovascular disease.  Asthma She complains of wheezing. There is no chest tightness, cough, difficulty breathing, frequent throat clearing, hemoptysis, hoarse voice, shortness of breath or sputum production. This is a chronic problem. The current episode started more than 1 year ago. The problem occurs daily. The problem has been waxing and waning. Pertinent negatives include no chest pain, dyspnea on exertion, ear pain, fever, headaches, heartburn, malaise/fatigue, myalgias, PND, postnasal drip, rhinorrhea, sneezing, sore throat or sweats. Her past medical history is significant for asthma.  Hyperlipidemia This is a chronic problem. The current episode started more than 1 year ago. The problem is uncontrolled. Recent lipid tests were reviewed and are variable. She has no history of chronic renal disease. Factors aggravating her hyperlipidemia include thiazides. Pertinent negatives include no chest pain, myalgias or shortness of breath. Current antihyperlipidemic treatment includes diet change. The current treatment provides moderate  improvement of lipids. There are no compliance problems.  Risk factors for coronary artery disease include dyslipidemia and hypertension.    Lab Results  Component Value Date   CREATININE 1.02 (H) 07/04/2019   BUN 18 07/04/2019   NA 141 07/04/2019   K 3.9 07/04/2019   CL 104 07/04/2019   CO2 21 07/04/2019   Lab Results  Component Value Date   CHOL 227 (H) 07/04/2019   HDL 75 07/04/2019   LDLCALC 135 (H) 07/04/2019   TRIG 84 07/04/2019   CHOLHDL 3.1 06/27/2018   Lab Results  Component Value Date   TSH 2.280 07/04/2019   Lab Results  Component Value Date   HGBA1C 5.5 06/01/2015     Review of Systems  Constitutional: Negative.  Negative for chills, fatigue, fever, malaise/fatigue and unexpected weight change.  HENT: Negative for congestion, ear discharge, ear pain, hoarse voice, postnasal drip, rhinorrhea, sinus pressure, sneezing and sore throat.   Eyes: Negative for blurred vision, photophobia, pain, discharge, redness and itching.  Respiratory: Positive for wheezing. Negative for cough, hemoptysis, sputum production, shortness of breath and stridor.   Cardiovascular: Negative for chest pain, dyspnea on exertion, palpitations, orthopnea, leg swelling and PND.  Gastrointestinal: Negative for abdominal pain, blood in stool, constipation, diarrhea, heartburn, nausea and vomiting.  Endocrine: Negative for cold intolerance, heat intolerance, polydipsia, polyphagia and polyuria.  Genitourinary: Negative for dysuria, flank pain, frequency, hematuria, menstrual problem, pelvic pain, urgency, vaginal bleeding and vaginal discharge.  Musculoskeletal: Negative for arthralgias, back pain, myalgias and neck pain.  Skin: Negative for rash.  Allergic/Immunologic: Negative for environmental allergies and food allergies.  Neurological: Negative for dizziness, weakness, light-headedness, numbness and headaches.  Hematological: Negative for adenopathy. Does not bruise/bleed easily.    Psychiatric/Behavioral: Negative for dysphoric mood. The patient is not nervous/anxious.  Patient Active Problem List   Diagnosis Date Noted  . Encounter for screening colonoscopy 07/28/2015  . Vitamin D deficiency 06/02/2015  . Hypothyroidism 06/02/2015  . Overweight 06/01/2015  . Essential hypertension 06/01/2015  . Smoker 06/01/2015  . 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 Intermittent low back pain 06/01/2015    No Known Allergies  Past Surgical History:  Procedure Laterality Date  . ABDOMINAL HYSTERECTOMY  2009  . APPENDECTOMY  1990's  . CESAREAN SECTION  1989  . DENTAL SURGERY     pulled top teeth- has dentures  . TONSILLECTOMY  2010    Social History   Tobacco Use  . Smoking status: Current Every Day Smoker    Packs/day: 1.00    Years: 30.00    Pack years: 30.00    Types: Cigarettes  . Smokeless tobacco: Never Used  . Tobacco comment: Patient not ready to quit yet  Substance Use Topics  . Alcohol use: Yes    Alcohol/week: 2.0 - 4.0 standard drinks    Types: 2 - 4 Cans of beer per week    Comment: 2-4 q day  . Drug use: No     Medication list has been reviewed and updated.  Current Meds  Medication Sig  . albuterol (PROAIR HFA) 108 (90 Base) MCG/ACT inhaler Inhale 2 puffs into the lungs every 6 (six) hours as needed for wheezing or shortness of breath.  . hydrochlorothiazide (HYDRODIURIL) 12.5 MG tablet TAKE 1 TABLET BY MOUTH EVERY DAY    PHQ 2/9 Scores 12/17/2019 08/19/2019 07/04/2019 06/27/2018  PHQ - 2 Score 0 0 0 0  PHQ- 9 Score 0 0 0 0    BP Readings from Last 3 Encounters:  12/17/19 130/80  08/19/19 136/89  07/04/19 (!) 140/100    Physical Exam Vitals and nursing note reviewed.  Constitutional:      General: She is not in acute distress.    Appearance: She is not diaphoretic.  HENT:     Head: Normocephalic and atraumatic.     Right Ear: Tympanic membrane, ear canal and external ear normal.     Left Ear: Tympanic membrane, ear canal and  external ear normal.     Nose: Nose normal.  Eyes:     General:        Right eye: No discharge.        Left eye: No discharge.     Conjunctiva/sclera: Conjunctivae normal.     Pupils: Pupils are equal, round, and reactive to light.  Neck:     Thyroid: No thyromegaly.     Vascular: No JVD.  Cardiovascular:     Rate and Rhythm: Normal rate and regular rhythm.     Heart sounds: Normal heart sounds. No murmur. No friction rub. No gallop.   Pulmonary:     Effort: Pulmonary effort is normal.     Breath sounds: Normal breath sounds. No wheezing or rhonchi.  Abdominal:     General: Bowel sounds are normal.     Palpations: Abdomen is soft. There is no mass.     Tenderness: There is no abdominal tenderness. There is no guarding.  Musculoskeletal:        General: Normal range of motion.     Cervical back: Normal range of motion and neck supple.  Lymphadenopathy:     Cervical: No cervical adenopathy.  Skin:    General: Skin is warm and dry.     Capillary Refill: Capillary refill takes less than 2 seconds.  Neurological:  Mental Status: She is alert.     Deep Tendon Reflexes: Reflexes are normal and symmetric.     Wt Readings from Last 3 Encounters:  12/17/19 166 lb (75.3 kg)  08/19/19 156 lb (70.8 kg)  07/04/19 153 lb (69.4 kg)    BP 130/80   Pulse 68   Ht 5\' 1"  (1.549 m)   Wt 166 lb (75.3 kg)   BMI 31.37 kg/m   Assessment and Plan:  1. 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  Controlled.  Stable.  Patient will continue albuterol inhaler 2 puffs every 6 hours as needed for wheezing or shortness of breath. - albuterol (PROAIR HFA) 108 (90 Base) MCG/ACT inhaler; Inhale 2 puffs into the lungs every 6 (six) hours as needed for wheezing or shortness of breath.  Dispense: 6.7 g; Refill: 11  2. Essential hypertension Chronic.  Controlled.  Stable.  Continue hydrochlorothiazide 12.5 mg once a day.  Will check renal function panel for GFR concerns. - Renal Function Panel -  hydrochlorothiazide (HYDRODIURIL) 12.5 MG tablet; Take 1 tablet (12.5 mg total) by mouth daily.  Dispense: 90 tablet; Refill: 1  3. Mixed hyperlipidemia Chronic.  Controlled.  Stable.  Will continue dietary control.  4. Smoker Patient has been advised of the health risks of smoking and counseled concerning cessation of tobacco products. I spent over 3 minutes for discussion and to answer questions.  5. Influenza vaccine needed Discussed and administered - Flu Vaccine QUAD 6+ mos PF IM (Fluarix Quad PF)

## 2019-12-18 LAB — RENAL FUNCTION PANEL
Albumin: 4.4 g/dL (ref 3.8–4.9)
BUN/Creatinine Ratio: 14 (ref 9–23)
BUN: 15 mg/dL (ref 6–24)
CO2: 22 mmol/L (ref 20–29)
Calcium: 9.8 mg/dL (ref 8.7–10.2)
Chloride: 101 mmol/L (ref 96–106)
Creatinine, Ser: 1.08 mg/dL — ABNORMAL HIGH (ref 0.57–1.00)
GFR calc Af Amer: 67 mL/min/{1.73_m2} (ref >59)
GFR calc non Af Amer: 58 mL/min/{1.73_m2} — ABNORMAL LOW (ref >59)
Glucose: 101 mg/dL — ABNORMAL HIGH (ref 65–99)
Phosphorus: 3.6 mg/dL (ref 3.0–4.3)
Potassium: 4 mmol/L (ref 3.5–5.2)
Sodium: 139 mmol/L (ref 134–144)

## 2020-03-27 ENCOUNTER — Other Ambulatory Visit: Payer: Self-pay | Admitting: Family Medicine

## 2020-03-27 DIAGNOSIS — I10 Essential (primary) hypertension: Secondary | ICD-10-CM

## 2020-03-27 NOTE — Telephone Encounter (Signed)
Requested medication (s) are due for refill today: yes  Requested medication (s) are on the active medication list: yes  Last refill:  11/22/19  Future visit scheduled: yes 3 months  Notes to clinic: Dx code needed    Requested Prescriptions  Pending Prescriptions Disp Refills   hydrochlorothiazide (HYDRODIURIL) 12.5 MG tablet [Pharmacy Med Name: HYDROCHLOROTHIAZIDE 12.5 MG TB] 90 tablet 0    Sig: TAKE 1 TABLET BY MOUTH EVERY DAY      Cardiovascular: Diuretics - Thiazide Failed - 03/27/2020  4:22 PM      Failed - Cr in normal range and within 360 days    Creatinine, Ser  Date Value Ref Range Status  12/17/2019 1.08 (H) 0.57 - 1.00 mg/dL Final          Passed - Ca in normal range and within 360 days    Calcium  Date Value Ref Range Status  12/17/2019 9.8 8.7 - 10.2 mg/dL Final          Passed - K in normal range and within 360 days    Potassium  Date Value Ref Range Status  12/17/2019 4.0 3.5 - 5.2 mmol/L Final          Passed - Na in normal range and within 360 days    Sodium  Date Value Ref Range Status  12/17/2019 139 134 - 144 mmol/L Final          Passed - Last BP in normal range    BP Readings from Last 1 Encounters:  12/17/19 130/80          Passed - Valid encounter within last 6 months    Recent Outpatient Visits           3 months ago Essential hypertension   [place], Deanna C, MD   7 months ago Essential hypertension   [place], Deanna C, MD   8 months ago Breast cancer screening by mammogram   [facility] Juline Patch, MD   1 year ago Annual physical exam   [place], Deanna C, MD   2 years ago Essential hypertension   [place], Deanna C, MD       Future Appointments             In 3 months Juline Patch, MD [facility]

## 2020-06-05 ENCOUNTER — Other Ambulatory Visit: Payer: Self-pay | Admitting: Family Medicine

## 2020-06-05 DIAGNOSIS — I10 Essential (primary) hypertension: Secondary | ICD-10-CM

## 2020-07-07 ENCOUNTER — Ambulatory Visit (INDEPENDENT_AMBULATORY_CARE_PROVIDER_SITE_OTHER): Payer: Commercial Managed Care - PPO | Admitting: Family Medicine

## 2020-07-07 ENCOUNTER — Other Ambulatory Visit: Payer: Self-pay

## 2020-07-07 ENCOUNTER — Encounter: Payer: Self-pay | Admitting: Family Medicine

## 2020-07-07 VITALS — BP 120/80 | HR 64 | Ht 61.0 in | Wt 170.0 lb

## 2020-07-07 DIAGNOSIS — J449 Chronic obstructive pulmonary disease, unspecified: Secondary | ICD-10-CM

## 2020-07-07 DIAGNOSIS — I10 Essential (primary) hypertension: Secondary | ICD-10-CM

## 2020-07-07 DIAGNOSIS — Z0000 Encounter for general adult medical examination without abnormal findings: Secondary | ICD-10-CM | POA: Diagnosis not present

## 2020-07-07 DIAGNOSIS — E669 Obesity, unspecified: Secondary | ICD-10-CM

## 2020-07-07 DIAGNOSIS — E782 Mixed hyperlipidemia: Secondary | ICD-10-CM | POA: Diagnosis not present

## 2020-07-07 DIAGNOSIS — Z8639 Personal history of other endocrine, nutritional and metabolic disease: Secondary | ICD-10-CM

## 2020-07-07 DIAGNOSIS — Z1231 Encounter for screening mammogram for malignant neoplasm of breast: Secondary | ICD-10-CM | POA: Diagnosis not present

## 2020-07-07 DIAGNOSIS — F17200 Nicotine dependence, unspecified, uncomplicated: Secondary | ICD-10-CM

## 2020-07-07 MED ORDER — HYDROCHLOROTHIAZIDE 12.5 MG PO TABS: 12.5000 mg | ORAL_TABLET | Freq: Every day | ORAL | 1 refills | Status: DC

## 2020-07-07 MED ORDER — ALBUTEROL SULFATE HFA 108 (90 BASE) MCG/ACT IN AERS: 2.0000 | INHALATION_SPRAY | Freq: Four times a day (QID) | RESPIRATORY_TRACT | 11 refills | Status: DC | PRN

## 2020-07-07 NOTE — Progress Notes (Signed)
Date:  07/07/2020   Name:  Tamara Olson   DOB:  24-May-1966   MRN:  [id number]   Chief Complaint: Annual Exam (no pap- needs mammo)  Patient is a 54 year old female who presents for a comprehensive physical exam. The patient reports the following problems: htn. Health maintenance has been reviewed mammogram.   Lab Results  Component Value Date   CREATININE 1.08 (H) 12/17/2019   BUN 15 12/17/2019   NA 139 12/17/2019   K 4.0 12/17/2019   CL 101 12/17/2019   CO2 22 12/17/2019   Lab Results  Component Value Date   CHOL 227 (H) 07/04/2019   HDL 75 07/04/2019   LDLCALC 135 (H) 07/04/2019   TRIG 84 07/04/2019   CHOLHDL 3.1 06/27/2018   Lab Results  Component Value Date   TSH 2.280 07/04/2019   Lab Results  Component Value Date   HGBA1C 5.5 06/01/2015   Lab Results  Component Value Date   WBC 8.6 06/01/2015   HGB 14.3 06/01/2015   HCT 43.5 06/01/2015   MCV 96 06/01/2015   PLT 329 06/01/2015   Lab Results  Component Value Date   ALT 11 06/01/2015   AST 15 06/01/2015   ALKPHOS 56 06/01/2015   BILITOT <0.2 06/01/2015     Review of Systems  Constitutional: Negative.  Negative for chills, fatigue, fever and unexpected weight change.  HENT: Negative for congestion, ear discharge, ear pain, rhinorrhea, sinus pressure, sneezing and sore throat.   Eyes: Negative for photophobia, pain, discharge, redness and itching.  Respiratory: Negative for cough, shortness of breath, wheezing and stridor.   Gastrointestinal: Negative for abdominal pain, blood in stool, constipation, diarrhea, nausea and vomiting.  Endocrine: Negative for cold intolerance, heat intolerance, polydipsia, polyphagia and polyuria.  Genitourinary: Negative for dysuria, flank pain, frequency, hematuria, menstrual problem, pelvic pain, urgency, vaginal bleeding and vaginal discharge.  Musculoskeletal: Negative for arthralgias, back pain and myalgias.  Skin: Negative for rash.  Allergic/Immunologic:  Negative for environmental allergies and food allergies.  Neurological: Negative for dizziness, weakness, light-headedness, numbness and headaches.  Hematological: Negative for adenopathy. Does not bruise/bleed easily.  Psychiatric/Behavioral: Negative for dysphoric mood. The patient is not nervous/anxious.     Patient Active Problem List   Diagnosis Date Noted  . Encounter for screening colonoscopy 07/28/2015  . Vitamin D deficiency 06/02/2015  . Hypothyroidism 06/02/2015  . Overweight 06/01/2015  . Essential hypertension 06/01/2015  . Smoker 06/01/2015  . 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 Intermittent low back pain 06/01/2015    No Known Allergies  Past Surgical History:  Procedure Laterality Date  . ABDOMINAL HYSTERECTOMY  2009  . APPENDECTOMY  1990's  . CESAREAN SECTION  1989  . DENTAL SURGERY     pulled top teeth- has dentures  . TONSILLECTOMY  2010    Social History   Tobacco Use  . Smoking status: Current Every Day Smoker    Packs/day: 1.00    Years: 30.00    Pack years: 30.00    Types: Cigarettes  . Smokeless tobacco: Never Used  . Tobacco comment: Patient not ready to quit yet  Vaping Use  . Vaping Use: Never used  Substance Use Topics  . Alcohol use: Yes    Alcohol/week: 2.0 - 4.0 standard drinks    Types: 2 - 4 Cans of beer per week    Comment: 2-4 q day  . Drug use: No     Medication list has been reviewed and updated.  Current Meds  Medication Sig  . albuterol (PROAIR HFA) 108 (90 Base) MCG/ACT inhaler Inhale 2 puffs into the lungs every 6 (six) hours as needed for wheezing or shortness of breath.  . hydrochlorothiazide (HYDRODIURIL) 12.5 MG tablet TAKE 1 TABLET BY MOUTH EVERY DAY    PHQ 2/9 Scores 07/07/2020 12/17/2019 08/19/2019 07/04/2019  PHQ - 2 Score 0 0 0 0  PHQ- 9 Score 0 0 0 0    GAD 7 : Generalized Anxiety Score 07/07/2020 12/17/2019  Nervous, Anxious, on Edge 0 0  Control/stop worrying 0 0  Worry too much - different things 0 0  Trouble  relaxing 0 0  Restless 0 0  Easily annoyed or irritable 0 0  Afraid - awful might happen 0 0  Total GAD 7 Score 0 0    BP Readings from Last 3 Encounters:  07/07/20 120/80  12/17/19 130/80  08/19/19 136/89    Physical Exam Vitals and nursing note reviewed.  Constitutional:      Appearance: She is well-developed. She is obese.  HENT:     Head: Normocephalic.     Jaw: There is normal jaw occlusion.     Right Ear: Hearing, tympanic membrane, ear canal and external ear normal.     Left Ear: Hearing, tympanic membrane, ear canal and external ear normal.     Nose: Nose normal.     Mouth/Throat:     Lips: Pink.     Mouth: Mucous membranes are moist. Mucous membranes are pale.     Dentition: Normal dentition.     Tongue: No lesions.     Palate: No mass.     Pharynx: Oropharynx is clear. Uvula midline. No pharyngeal swelling or oropharyngeal exudate.  Eyes:     General: Lids are normal. Vision grossly intact. Gaze aligned appropriately. No scleral icterus.       Left eye: No foreign body or hordeolum.     Extraocular Movements: Extraocular movements intact.     Conjunctiva/sclera: Conjunctivae normal.     Right eye: Right conjunctiva is not injected.     Left eye: Left conjunctiva is not injected.     Pupils: Pupils are equal, round, and reactive to light.     Funduscopic exam:    Right eye: Red reflex present.        Left eye: Red reflex present. Neck:     Thyroid: No thyroid mass, thyromegaly or thyroid tenderness.     Vascular: Normal carotid pulses. No carotid bruit, hepatojugular reflux or JVD.     Trachea: Trachea and phonation normal. No tracheal deviation.  Cardiovascular:     Rate and Rhythm: Normal rate and regular rhythm.     Pulses: Normal pulses.          Carotid pulses are 2+ on the right side and 2+ on the left side.      Radial pulses are 2+ on the right side and 2+ on the left side.       Femoral pulses are 2+ on the right side and 2+ on the left side.       Popliteal pulses are 2+ on the right side and 2+ on the left side.       Dorsalis pedis pulses are 2+ on the right side and 2+ on the left side.       Posterior tibial pulses are 2+ on the right side and 2+ on the left side.     Heart sounds: Normal heart sounds, S1  normal and S2 normal. No murmur heard.  No systolic murmur is present.  No diastolic murmur is present.  No friction rub. No gallop. No S3 or S4 sounds.   Pulmonary:     Effort: Pulmonary effort is normal. No respiratory distress.     Breath sounds: Normal breath sounds. No decreased breath sounds, wheezing, rhonchi or rales.  Chest:     Chest wall: No mass.     Breasts:        Right: Normal. No swelling, bleeding, inverted nipple, mass, nipple discharge, skin change or tenderness.        Left: Normal. No swelling, bleeding, inverted nipple, mass, nipple discharge, skin change or tenderness.     Comments: Fibroglandular tissue left outer/pt says "always been there" Abdominal:     General: Bowel sounds are normal.     Palpations: Abdomen is soft. There is no hepatomegaly, splenomegaly or mass.     Tenderness: There is no abdominal tenderness. There is no right CVA tenderness, left CVA tenderness, guarding or rebound.     Hernia: No hernia is present. There is no hernia in the left inguinal area or right inguinal area.  Genitourinary:    Rectum: Normal. Guaiac result negative. No mass.  Musculoskeletal:        General: No tenderness. Normal range of motion.     Cervical back: Normal, full passive range of motion without pain, normal range of motion and neck supple.     Thoracic back: Normal.     Lumbar back: Normal.  Lymphadenopathy:     Head:     Right side of head: No submental or submandibular adenopathy.     Left side of head: No submental or submandibular adenopathy.     Cervical: No cervical adenopathy.     Right cervical: No superficial, deep or posterior cervical adenopathy.    Left cervical: No superficial, deep  or posterior cervical adenopathy.     Upper Body:     Right upper body: No supraclavicular or axillary adenopathy.     Left upper body: No supraclavicular or axillary adenopathy.     Lower Body: No right inguinal adenopathy. No left inguinal adenopathy.  Skin:    General: Skin is warm.     Capillary Refill: Capillary refill takes less than 2 seconds.     Findings: No rash.  Neurological:     General: No focal deficit present.     Mental Status: She is alert and oriented to person, place, and time.     Cranial Nerves: Cranial nerves are intact. No cranial nerve deficit.     Motor: Motor function is intact.     Deep Tendon Reflexes: Reflexes normal.     Reflex Scores:      Tricep reflexes are 2+ on the right side and 2+ on the left side.      Bicep reflexes are 2+ on the right side and 2+ on the left side.      Brachioradialis reflexes are 2+ on the right side and 2+ on the left side.      Patellar reflexes are 2+ on the right side and 2+ on the left side.      Achilles reflexes are 2+ on the right side and 2+ on the left side. Psychiatric:        Mood and Affect: Mood is not anxious or depressed.        Behavior: Behavior is cooperative.     Wt Readings from Last  3 Encounters:  07/07/20 170 lb (77.1 kg)  12/17/19 166 lb (75.3 kg)  08/19/19 156 lb (70.8 kg)    BP 120/80   Pulse 64   Ht 5\' 1"  (1.549 m)   Wt 170 lb (77.1 kg)   BMI 32.12 kg/m   Assessment and Plan: 1. Annual physical exam No subjective/objective concerns noted during history and physical exam.  Patient's chart was reviewed for encounters, last labs, most recent imaging, and care everywhere.Tamara Olson is a 54 y.o. female who presents today for her Complete Annual Exam. She feels well. She reports exercising . She reports she is sleeping well.Immunizations are reviewed and recommendations provided.   Age appropriate screening tests are discussed. Counseling given for risk factor reduction interventions.   2.  Breast cancer screening by mammogram Discussed with patient and appointment was arranged. - MM 3D SCREEN BREAST BILATERAL; Future  3. Essential hypertension Chronic.  Controlled.  Stable.  Continue hydrochlorothiazide 12.5 mg once a day.  Will check CMP. - hydrochlorothiazide (HYDRODIURIL) 12.5 MG tablet; Take 1 tablet (12.5 mg total) by mouth daily.  Dispense: 90 tablet; Refill: 1 - Comprehensive metabolic panel  4. Mixed hyperlipidemia Chronic.  Controlled.  Stable.  Patient is currently on dietary approach to controlling elevated lipids will check lipid panel for current status. - Lipid Panel With LDL/HDL Ratio  5. Obesity (BMI 30-39.9) In the past patient has had thyroid issues during pregnancy.  Noting this in the history I asked whether or not it has been followed up on and I do not see any further evaluation.  Will check TSH on current status. - TSH  6. 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  Controlled.  Stable.  Patient will continue albuterol 2 puffs every 6 hours as needed wheezing. - albuterol (PROAIR HFA) 108 (90 Base) MCG/ACT inhaler; Inhale 2 puffs into the lungs every 6 (six) hours as needed for wheezing or shortness of breath.  Dispense: 6.7 g; Refill: 11  7. Smoker Patient has been advised of the health risks of smoking and counseled concerning cessation of tobacco products. I spent over 3 minutes for discussion and to answer questions.  Patient is currently not interested in discontinuance but information will be provided upon request.  8. History of hypothyroidism As noted above. - TSH

## 2020-07-08 LAB — LIPID PANEL WITH LDL/HDL RATIO
Cholesterol, Total: 276 mg/dL — ABNORMAL HIGH (ref 100–199)
HDL: 72 mg/dL (ref >39)
LDL Chol Calc (NIH): 169 mg/dL — ABNORMAL HIGH (ref 0–99)
LDL/HDL Ratio: 2.3 ratio (ref 0.0–3.2)
Triglycerides: 195 mg/dL — ABNORMAL HIGH (ref 0–149)
VLDL Cholesterol Cal: 35 mg/dL (ref 5–40)

## 2020-07-08 LAB — COMPREHENSIVE METABOLIC PANEL
ALT: 20 IU/L (ref 0–32)
AST: 23 IU/L (ref 0–40)
Albumin/Globulin Ratio: 1.5 (ref 1.2–2.2)
Albumin: 4.1 g/dL (ref 3.8–4.9)
Alkaline Phosphatase: 84 IU/L (ref 48–121)
BUN/Creatinine Ratio: 11 (ref 9–23)
BUN: 10 mg/dL (ref 6–24)
Bilirubin Total: 0.2 mg/dL (ref 0.0–1.2)
CO2: 24 mmol/L (ref 20–29)
Calcium: 9.7 mg/dL (ref 8.7–10.2)
Chloride: 99 mmol/L (ref 96–106)
Creatinine, Ser: 0.93 mg/dL (ref 0.57–1.00)
GFR calc Af Amer: 80 mL/min/{1.73_m2} (ref >59)
GFR calc non Af Amer: 69 mL/min/{1.73_m2} (ref >59)
Globulin, Total: 2.8 g/dL (ref 1.5–4.5)
Glucose: 81 mg/dL (ref 65–99)
Potassium: 4 mmol/L (ref 3.5–5.2)
Sodium: 138 mmol/L (ref 134–144)
Total Protein: 6.9 g/dL (ref 6.0–8.5)

## 2020-07-08 LAB — TSH: TSH: 4.09 u[IU]/mL (ref 0.450–4.500)

## 2020-08-18 ENCOUNTER — Ambulatory Visit
Admission: RE | Admit: 2020-08-18 | Discharge: 2020-08-18 | Disposition: A | Discharge status: Home / Self Care | Payer: Commercial Managed Care - PPO | Source: Ambulatory Visit | Attending: Family Medicine | Admitting: Family Medicine

## 2020-08-18 ENCOUNTER — Other Ambulatory Visit: Payer: Self-pay

## 2020-08-18 DIAGNOSIS — Z1231 Encounter for screening mammogram for malignant neoplasm of breast: Secondary | ICD-10-CM

## 2021-01-11 ENCOUNTER — Ambulatory Visit: Payer: Commercial Managed Care - PPO | Admitting: Family Medicine

## 2021-02-25 DIAGNOSIS — Z90710 Acquired absence of both cervix and uterus: Secondary | ICD-10-CM | POA: Insufficient documentation

## 2021-05-23 ENCOUNTER — Other Ambulatory Visit: Payer: Self-pay | Admitting: Family Medicine

## 2021-05-23 DIAGNOSIS — I10 Essential (primary) hypertension: Secondary | ICD-10-CM

## 2021-05-23 NOTE — Telephone Encounter (Signed)
Requested medication (s) are due for refill today: yes  Requested medication (s) are on the active medication list: yes  Last refill:  07/07/20 #90 1 RF  Future visit scheduled: yes  Notes to clinic:  > 3 months overdue for appt   Requested Prescriptions  Pending Prescriptions Disp Refills   hydrochlorothiazide (HYDRODIURIL) 12.5 MG tablet [Pharmacy Med Name: HYDROCHLOROTHIAZIDE 12.5 MG TB] 90 tablet 1    Sig: TAKE 1 TABLET BY MOUTH EVERY DAY      Cardiovascular: Diuretics - Thiazide Failed - 05/23/2021  9:12 AM      Failed - Valid encounter within last 6 months    Recent Outpatient Visits           10 months ago Annual physical exam   [facility] Juline Patch, MD   1 year ago Essential hypertension   [place], Deanna C, MD   1 year ago Essential hypertension   [place], Deanna C, MD   1 year ago Breast cancer screening by mammogram   [facility] Juline Patch, MD   2 years ago Annual physical exam   [facility] Juline Patch, MD       Future Appointments             In 1 month Juline Patch, MD [facility], [place] in normal range and within 360 days    Calcium  Date Value Ref Range Status  07/07/2020 9.7 8.7 - 10.2 mg/dL Final          Passed - Cr in normal range and within 360 days    Creatinine, Ser  Date Value Ref Range Status  07/07/2020 0.93 0.57 - 1.00 mg/dL Final          Passed - K in normal range and within 360 days    Potassium  Date Value Ref Range Status  07/07/2020 4.0 3.5 - 5.2 mmol/L Final          Passed - Na in normal range and within 360 days    Sodium  Date Value Ref Range Status  07/07/2020 138 134 - 144 mmol/L Final          Passed - Last BP in normal range    BP Readings from Last 1 Encounters:  07/07/20 120/80

## 2021-05-24 NOTE — Telephone Encounter (Signed)
Left voice mail for patient to set up appointment this month

## 2021-06-02 ENCOUNTER — Other Ambulatory Visit: Payer: Self-pay | Admitting: Family Medicine

## 2021-06-02 DIAGNOSIS — I10 Essential (primary) hypertension: Secondary | ICD-10-CM

## 2021-07-09 ENCOUNTER — Other Ambulatory Visit: Payer: Self-pay | Admitting: Family Medicine

## 2021-07-09 ENCOUNTER — Encounter: Payer: Self-pay | Admitting: Family Medicine

## 2021-07-09 ENCOUNTER — Other Ambulatory Visit: Payer: Self-pay

## 2021-07-09 ENCOUNTER — Ambulatory Visit (INDEPENDENT_AMBULATORY_CARE_PROVIDER_SITE_OTHER): Payer: Commercial Managed Care - PPO | Admitting: Family Medicine

## 2021-07-09 VITALS — BP 130/100 | HR 84 | Ht 61.0 in | Wt 152.0 lb

## 2021-07-09 DIAGNOSIS — Z0000 Encounter for general adult medical examination without abnormal findings: Secondary | ICD-10-CM

## 2021-07-09 DIAGNOSIS — E663 Overweight: Secondary | ICD-10-CM

## 2021-07-09 DIAGNOSIS — R69 Illness, unspecified: Secondary | ICD-10-CM

## 2021-07-09 DIAGNOSIS — Z1231 Encounter for screening mammogram for malignant neoplasm of breast: Secondary | ICD-10-CM | POA: Diagnosis not present

## 2021-07-09 DIAGNOSIS — E782 Mixed hyperlipidemia: Secondary | ICD-10-CM

## 2021-07-09 LAB — HEMOCCULT GUIAC POC 1CARD (OFFICE): Fecal Occult Blood, POC: NEGATIVE

## 2021-07-09 NOTE — Progress Notes (Signed)
Date:  07/09/2021   Name:  Tamara Olson   DOB:  January 15, 1966   MRN:  [id number]   Chief Complaint: Annual Exam (No pap)  Tamara Olson is a 55 y.o. female who presents today for her Complete Annual Exam. She feels well. She reports exercising walking . She reports she is sleeping well.               Patient is a 55 year old female who presents for a comprehensive physical exam. The patient reports the following problems: none. Health maintenance has been reviewed up to date.     Lab Results  Component Value Date   CREATININE 0.93 07/07/2020   BUN 10 07/07/2020   NA 138 07/07/2020   K 4.0 07/07/2020   CL 99 07/07/2020   CO2 24 07/07/2020   Lab Results  Component Value Date   CHOL 276 (H) 07/07/2020   HDL 72 07/07/2020   LDLCALC 169 (H) 07/07/2020   TRIG 195 (H) 07/07/2020   CHOLHDL 3.1 06/27/2018   Lab Results  Component Value Date   TSH 4.090 07/07/2020   Lab Results  Component Value Date   HGBA1C 5.5 06/01/2015   Lab Results  Component Value Date   WBC 8.6 06/01/2015   HGB 14.3 06/01/2015   HCT 43.5 06/01/2015   MCV 96 06/01/2015   PLT 329 06/01/2015   Lab Results  Component Value Date   ALT 20 07/07/2020   AST 23 07/07/2020   ALKPHOS 84 07/07/2020   BILITOT 0.2 07/07/2020     Review of Systems  Constitutional:  Negative for chills and fever.  HENT:  Negative for drooling, ear discharge, ear pain and sore throat.   Respiratory:  Negative for cough, shortness of breath and wheezing.   Cardiovascular:  Negative for chest pain, palpitations and leg swelling.  Gastrointestinal:  Negative for abdominal pain, blood in stool, constipation, diarrhea and nausea.  Endocrine: Negative for polydipsia.  Genitourinary:  Negative for dysuria, frequency, hematuria and urgency.  Musculoskeletal:  Negative for back pain, myalgias and neck pain.  Skin:  Negative for rash.  Allergic/Immunologic: Negative for environmental allergies.  Neurological:  Negative for  dizziness and headaches.  Hematological:  Does not bruise/bleed easily.  Psychiatric/Behavioral:  Negative for suicidal ideas. The patient is not nervous/anxious.    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/01/2015   Intermittent low back pain 06/01/2015    No Known Allergies  Past Surgical History:  Procedure Laterality Date   ABDOMINAL HYSTERECTOMY  2009   APPENDECTOMY  1990's   CESAREAN SECTION  1989   DENTAL SURGERY     pulled top teeth- has dentures   TONSILLECTOMY  2010    Social History   Tobacco Use   Smoking status: Every Day    Packs/day: 1.00    Years: 30.00    Pack years: 30.00    Types: Cigarettes   Smokeless tobacco: Never   Tobacco comments:    Patient not ready to quit yet  Vaping Use   Vaping Use: Never used  Substance Use Topics   Alcohol use: Yes    Alcohol/week: 2.0 - 4.0 standard drinks    Types: 2 - 4 Cans of  beer per week    Comment: 2-4 q day   Drug use: No     Medication list has been reviewed and updated.  Current Meds  Medication Sig   albuterol (PROAIR HFA) 108 (90 Base) MCG/ACT inhaler Inhale 2 puffs into the lungs every 6 (six) hours as needed for wheezing or shortness of breath.   hydrochlorothiazide (HYDRODIURIL) 12.5 MG tablet TAKE 1 TABLET BY MOUTH EVERY DAY    PHQ 2/9 Scores 07/09/2021 07/07/2020 12/17/2019 08/19/2019  PHQ - 2 Score 0 0 0 0  PHQ- 9 Score 0 0 0 0    GAD 7 : Generalized Anxiety Score 07/09/2021 07/07/2020 12/17/2019  Nervous, Anxious, on Edge 0 0 0  Control/stop worrying 0 0 0  Worry too much - different things 0 0 0  Trouble relaxing 0 0 0  Restless 0 0 0  Easily annoyed or irritable 0 0 0  Afraid - awful might happen 0 0 0  Total GAD 7  Score 0 0 0    BP Readings from Last 3 Encounters:  07/07/20 120/80  12/17/19 130/80  08/19/19 136/89    Physical Exam Vitals and nursing note reviewed.  Constitutional:      Appearance: Normal appearance. She is well-developed, well-groomed and overweight.  HENT:     Head: Normocephalic.     Jaw: There is normal jaw occlusion.     Right Ear: Hearing, tympanic membrane, ear canal and external ear normal. There is no impacted cerumen.     Left Ear: Hearing, tympanic membrane, ear canal and external ear normal. There is no impacted cerumen.     Nose: Nose normal.     Right Turbinates: Not enlarged, swollen or pale.     Left Turbinates: Not enlarged, swollen or pale.     Mouth/Throat:     Lips: Pink.     Mouth: Mucous membranes are moist.     Dentition: Normal dentition.  Eyes:     General: Lids are everted, no foreign bodies appreciated. No scleral icterus.       Left eye: No foreign body or hordeolum.     Conjunctiva/sclera: Conjunctivae normal.     Right eye: Right conjunctiva is not injected.     Left eye: Left conjunctiva is not injected.     Pupils: Pupils are equal, round, and reactive to light.  Neck:     Thyroid: No thyroid mass, thyromegaly or thyroid tenderness.     Vascular: No JVD.     Trachea: No tracheal deviation.  Cardiovascular:     Rate and Rhythm: Normal rate and regular rhythm.     Chest Wall: PMI is not displaced. No thrill.     Pulses: Normal pulses.          Carotid pulses are 2+ on the right side and 2+ on the left side.      Radial pulses are 2+ on the right side and 2+ on the left side.       Femoral pulses are 2+ on the right side and 2+ on the left side.      Popliteal pulses are 2+ on the right side and 2+ on the left side.       Dorsalis pedis pulses are 2+ on the right side and 2+ on the left side.       Posterior tibial pulses are 2+ on the right side and 2+ on the left side.     Heart sounds: Normal heart sounds, S1 normal  and S2 normal. No  murmur heard. No systolic murmur is present.  No diastolic murmur is present.    No friction rub. No gallop. No S3 or S4 sounds.  Pulmonary:     Effort: Pulmonary effort is normal. No respiratory distress.     Breath sounds: Normal breath sounds. No stridor. No decreased breath sounds, wheezing, rhonchi or rales.  Chest:  Breasts:    Right: Normal. No swelling, bleeding, inverted nipple, mass, nipple discharge, skin change or tenderness.     Left: Normal. No swelling, bleeding, inverted nipple, mass, nipple discharge, skin change or tenderness.  Abdominal:     General: Bowel sounds are normal.     Palpations: Abdomen is soft. There is no hepatomegaly, splenomegaly or mass.     Tenderness: There is no abdominal tenderness. There is no guarding or rebound.     Hernia: No hernia is present. There is no hernia in the umbilical area, ventral area, left inguinal area or right inguinal area.  Genitourinary:    Rectum: Normal. Guaiac result negative. No mass.  Musculoskeletal:        General: No tenderness. Normal range of motion.     Cervical back: Full passive range of motion without pain, normal range of motion and neck supple.     Right lower leg: No edema.     Left lower leg: No edema.  Lymphadenopathy:     Head:     Right side of head: No submental, submandibular or tonsillar adenopathy.     Left side of head: No submental, submandibular or tonsillar adenopathy.     Cervical: No cervical adenopathy.     Right cervical: No superficial, deep or posterior cervical adenopathy.    Left cervical: No superficial, deep or posterior cervical adenopathy.     Upper Body:     Right upper body: No supraclavicular or axillary adenopathy.     Left upper body: No supraclavicular or axillary adenopathy.     Lower Body: No right inguinal adenopathy. No left inguinal adenopathy.  Skin:    General: Skin is warm.     Findings: No rash.     Comments: Malar erythema c/w rosacea/ ? Raised hemagioma right  temple/appt derm  Neurological:     Mental Status: She is alert and oriented to person, place, and time.     Cranial Nerves: Cranial nerves are intact. No cranial nerve deficit.     Sensory: Sensation is intact.     Motor: Motor function is intact.     Deep Tendon Reflexes: Reflexes normal.  Psychiatric:        Mood and Affect: Mood is not anxious or depressed.        Behavior: Behavior is cooperative.    Wt Readings from Last 3 Encounters:  07/09/21 152 lb (68.9 kg)  07/07/20 170 lb (77.1 kg)  12/17/19 166 lb (75.3 kg)    Ht '5\' 1"'$  (1.549 m)   Wt 152 lb (68.9 kg)   BMI 28.72 kg/m   Assessment and Plan: Tamara Olson is a 55 y.o. female who presents today for her Complete Annual Exam. She feels well. She reports exercising vision. She reports she is sleeping well.  Patient's chart was reviewed for previous encounters most recent labs most recent imaging as well as care everywhere. 1. Annual physical exam Immunizations are reviewed and recommendations provided.   Age appropriate screening tests are discussed. Counseling given for risk factor reduction interventions.  No subjective/objective concerns noted during history  and physical exam.  - POCT occult blood stool  2. Breast cancer screening by mammogram Discussed with patient and mammography ordered. - MM 3D SCREEN BREAST BILATERAL  3. Mixed hyperlipidemia With history of lipid we will check lipid panel. - Lipid Panel With LDL/HDL Ratio  4. Taking medication for chronic disease Patient on medications and we will check CMP with hepatic surveillance. - Comprehensive Metabolic Panel (CMET)  5. Overweight Health risks of being over weight were discussed and patient was counseled on weight loss options and exercise.

## 2021-07-10 LAB — COMPREHENSIVE METABOLIC PANEL
ALT: 24 IU/L (ref 0–32)
AST: 29 IU/L (ref 0–40)
Albumin/Globulin Ratio: 1.7 (ref 1.2–2.2)
Albumin: 4.5 g/dL (ref 3.8–4.9)
Alkaline Phosphatase: 85 IU/L (ref 44–121)
BUN/Creatinine Ratio: 17 (ref 9–23)
BUN: 18 mg/dL (ref 6–24)
Bilirubin Total: <0.2 mg/dL (ref 0.0–1.2)
CO2: 22 mmol/L (ref 20–29)
Calcium: 10.6 mg/dL — ABNORMAL HIGH (ref 8.7–10.2)
Chloride: 96 mmol/L (ref 96–106)
Creatinine, Ser: 1.03 mg/dL — ABNORMAL HIGH (ref 0.57–1.00)
Globulin, Total: 2.6 g/dL (ref 1.5–4.5)
Glucose: 95 mg/dL (ref 65–99)
Potassium: 3.7 mmol/L (ref 3.5–5.2)
Sodium: 140 mmol/L (ref 134–144)
Total Protein: 7.1 g/dL (ref 6.0–8.5)
eGFR: 64 mL/min/{1.73_m2} (ref >59)

## 2021-07-10 LAB — LIPID PANEL WITH LDL/HDL RATIO
Cholesterol, Total: 284 mg/dL — ABNORMAL HIGH (ref 100–199)
HDL: 73 mg/dL (ref >39)
LDL Chol Calc (NIH): 180 mg/dL — ABNORMAL HIGH (ref 0–99)
LDL/HDL Ratio: 2.5 ratio (ref 0.0–3.2)
Triglycerides: 173 mg/dL — ABNORMAL HIGH (ref 0–149)
VLDL Cholesterol Cal: 31 mg/dL (ref 5–40)

## 2021-07-29 ENCOUNTER — Other Ambulatory Visit: Payer: Self-pay

## 2021-07-29 ENCOUNTER — Ambulatory Visit: Payer: Commercial Managed Care - PPO | Admitting: Family Medicine

## 2021-07-29 ENCOUNTER — Encounter: Payer: Self-pay | Admitting: Family Medicine

## 2021-07-29 VITALS — BP 110/86 | HR 80 | Ht 61.0 in | Wt 160.0 lb

## 2021-07-29 DIAGNOSIS — L989 Disorder of the skin and subcutaneous tissue, unspecified: Secondary | ICD-10-CM

## 2021-07-29 DIAGNOSIS — E782 Mixed hyperlipidemia: Secondary | ICD-10-CM

## 2021-07-29 DIAGNOSIS — I10 Essential (primary) hypertension: Secondary | ICD-10-CM | POA: Diagnosis not present

## 2021-07-29 MED ORDER — ATORVASTATIN CALCIUM 10 MG PO TABS: 10.0000 mg | ORAL_TABLET | Freq: Every day | ORAL | 1 refills | Status: DC

## 2021-07-29 MED ORDER — HYDROCHLOROTHIAZIDE 12.5 MG PO TABS: 12.5000 mg | ORAL_TABLET | Freq: Every day | ORAL | 1 refills | Status: DC

## 2021-07-29 NOTE — Progress Notes (Signed)
Date:  07/29/2021   Name:  Tamara Olson   DOB:  31-May-1966   MRN:  [id number]   Chief Complaint: Hypertension and Hyperlipidemia (Needs to start med for lipids)  Hypertension This is a chronic problem. The current episode started more than 1 year ago. The problem has been gradually improving since onset. The problem is controlled. Pertinent negatives include no anxiety, blurred vision, chest pain, headaches, malaise/fatigue, neck pain, orthopnea, palpitations, peripheral edema, PND, shortness of breath or sweats. Risk factors for coronary artery disease include dyslipidemia, obesity and stress. Past treatments include diuretics. The current treatment provides moderate improvement. There are no compliance problems.  There is no history of angina, kidney disease, CAD/MI, CVA, heart failure, left ventricular hypertrophy, PVD or retinopathy. There is no history of chronic renal disease, a hypertension causing med or renovascular disease.  Hyperlipidemia This is a chronic problem. The current episode started more than 1 year ago. The problem is uncontrolled. She has no history of chronic renal disease or diabetes. Pertinent negatives include no chest pain, focal sensory loss, focal weakness, leg pain, myalgias or shortness of breath. Current antihyperlipidemic treatment includes diet change. The current treatment provides moderate improvement of lipids. There are no compliance problems.    Lab Results  Component Value Date   CREATININE 1.03 (H) 07/09/2021   BUN 18 07/09/2021   NA 140 07/09/2021   K 3.7 07/09/2021   CL 96 07/09/2021   CO2 22 07/09/2021   Lab Results  Component Value Date   CHOL 284 (H) 07/09/2021   HDL 73 07/09/2021   LDLCALC 180 (H) 07/09/2021   TRIG 173 (H) 07/09/2021   CHOLHDL 3.1 06/27/2018   Lab Results  Component Value Date   TSH 4.090 07/07/2020   Lab Results  Component Value Date   HGBA1C 5.5 06/01/2015   Lab Results  Component Value Date   WBC 8.6  06/01/2015   HGB 14.3 06/01/2015   HCT 43.5 06/01/2015   MCV 96 06/01/2015   PLT 329 06/01/2015   Lab Results  Component Value Date   ALT 24 07/09/2021   AST 29 07/09/2021   ALKPHOS 85 07/09/2021   BILITOT <0.2 07/09/2021     Review of Systems  Constitutional:  Negative for malaise/fatigue.  Eyes:  Negative for blurred vision.  Respiratory:  Negative for shortness of breath.   Cardiovascular:  Negative for chest pain, palpitations, orthopnea and PND.  Musculoskeletal:  Negative for myalgias and neck pain.  Neurological:  Negative for focal weakness and headaches.   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   Intermittent low back pain 06/01/2015    No Known Allergies  Past Surgical History:  Procedure Laterality Date   ABDOMINAL HYSTERECTOMY  2009   APPENDECTOMY  1990's   CESAREAN SECTION  1989   DENTAL SURGERY     pulled top teeth- has dentures   TONSILLECTOMY  2010    Social History   Tobacco Use   Smoking status: Every Day    Packs/day: 1.00    Years: 30.00    Pack years: 30.00    Types: Cigarettes   Smokeless tobacco: Never   Tobacco comments:    Patient not ready to quit yet  Vaping Use   Vaping Use: Never used  Substance Use Topics   Alcohol use: Yes    Alcohol/week: 2.0 - 4.0 standard drinks    Types: 2 - 4 Cans of beer per week    Comment: 2-4 q day   Drug use: No     Medication list has been reviewed and updated.  Current Meds  Medication Sig   albuterol (PROAIR HFA) 108 (90 Base) MCG/ACT inhaler Inhale 2 puffs into the lungs every 6 (six) hours as needed for wheezing or shortness of breath.   hydrochlorothiazide (HYDRODIURIL) 12.5 MG tablet TAKE 1 TABLET BY  MOUTH EVERY DAY    PHQ 2/9 Scores 07/09/2021 07/07/2020 12/17/2019 08/19/2019  PHQ - 2 Score 0 0 0 0  PHQ- 9 Score 0 0 0 0    GAD 7 : Generalized Anxiety Score 07/09/2021 07/07/2020 12/17/2019  Nervous, Anxious, on Edge 0 0 0  Control/stop worrying 0 0 0  Worry too much - different things 0 0 0  Trouble relaxing 0 0 0  Restless 0 0 0  Easily annoyed or irritable 0 0 0  Afraid - awful might happen 0 0 0  Total GAD 7 Score 0 0 0    BP Readings from Last 3 Encounters:  07/09/21 (!) 130/100  07/07/20 120/80  12/17/19 130/80    Physical Exam  Wt Readings from Last 3 Encounters:  07/29/21 160 lb (72.6 kg)  07/09/21 152 lb (68.9 kg)  07/07/20 170 lb (77.1 kg)    Ht '5\' 1"'$  (1.549 m)   Wt 160 lb (72.6 kg)   BMI 30.23 kg/m   Assessment and Plan:  1. Essential hypertension Chronic.  Controlled.  Blood pressure today is 110/86.  Continue hydrochlorothiazide 12.5 mg once a day. - hydrochlorothiazide (HYDRODIURIL) 12.5 MG tablet; Take 1 tablet (12.5 mg total) by mouth daily.  Dispense: 90 tablet; Refill: 1  2. Mixed hyperlipidemia Chronic controlled uncomplicated. Initiate atorvastain 10 mg q day - atorvastatin (LIPITOR) 10 MG tablet; Take 1 tablet (10 mg total) by mouth daily.  Dispense: 90 tablet; Refill: 1  3. Facial lesion Referrel to dermatology - Ambulatory referral to Dermatology

## 2021-08-02 ENCOUNTER — Ambulatory Visit: Payer: Commercial Managed Care - PPO | Admitting: Family Medicine

## 2021-08-26 ENCOUNTER — Ambulatory Visit
Admission: RE | Admit: 2021-08-26 | Discharge: 2021-08-26 | Disposition: A | Discharge status: Home / Self Care | Payer: Commercial Managed Care - PPO | Source: Ambulatory Visit | Attending: Family Medicine | Admitting: Family Medicine

## 2021-08-26 ENCOUNTER — Other Ambulatory Visit: Payer: Self-pay

## 2021-08-26 DIAGNOSIS — Z1231 Encounter for screening mammogram for malignant neoplasm of breast: Secondary | ICD-10-CM | POA: Insufficient documentation

## 2021-09-01 ENCOUNTER — Other Ambulatory Visit: Payer: Self-pay | Admitting: Family Medicine

## 2021-09-01 DIAGNOSIS — R928 Other abnormal and inconclusive findings on diagnostic imaging of breast: Secondary | ICD-10-CM

## 2021-09-01 DIAGNOSIS — N6489 Other specified disorders of breast: Secondary | ICD-10-CM

## 2021-09-21 ENCOUNTER — Other Ambulatory Visit: Payer: Self-pay

## 2021-09-21 ENCOUNTER — Ambulatory Visit
Admission: RE | Admit: 2021-09-21 | Discharge: 2021-09-21 | Disposition: A | Discharge status: Home / Self Care | Payer: Commercial Managed Care - PPO | Source: Ambulatory Visit | Attending: Family Medicine | Admitting: Family Medicine

## 2021-09-21 DIAGNOSIS — R928 Other abnormal and inconclusive findings on diagnostic imaging of breast: Secondary | ICD-10-CM

## 2021-09-21 DIAGNOSIS — N6489 Other specified disorders of breast: Secondary | ICD-10-CM | POA: Diagnosis present

## 2022-01-27 ENCOUNTER — Ambulatory Visit: Payer: Commercial Managed Care - PPO | Admitting: Family Medicine

## 2022-03-26 ENCOUNTER — Other Ambulatory Visit: Payer: Self-pay | Admitting: Family Medicine

## 2022-03-26 DIAGNOSIS — I10 Essential (primary) hypertension: Secondary | ICD-10-CM

## 2022-03-26 DIAGNOSIS — E782 Mixed hyperlipidemia: Secondary | ICD-10-CM

## 2022-03-28 NOTE — Telephone Encounter (Signed)
Requested Prescriptions  ?Pending Prescriptions Disp Refills  ?? hydrochlorothiazide (HYDRODIURIL) 12.5 MG tablet [Pharmacy Med Name: HYDROCHLOROTHIAZIDE 12.5 MG TB] 90 tablet 1  ?  Sig: TAKE 1 TABLET BY MOUTH EVERY DAY  ?  ? Cardiovascular: Diuretics - Thiazide Failed - 03/26/2022  9:17 AM  ?  ?  Failed - Cr in normal range and within 180 days  ?  Creatinine, Ser  ?Date Value Ref Range Status  ?07/09/2021 1.03 (H) 0.57 - 1.00 mg/dL Final  ?   ?  ?  Failed - K in normal range and within 180 days  ?  Potassium  ?Date Value Ref Range Status  ?07/09/2021 3.7 3.5 - 5.2 mmol/L Final  ?   ?  ?  Failed - Na in normal range and within 180 days  ?  Sodium  ?Date Value Ref Range Status  ?07/09/2021 140 134 - 144 mmol/L Final  ?   ?  ?  Failed - Valid encounter within last 6 months  ?  Recent Outpatient Visits   ?      ? 8 months ago Essential hypertension  ? [facility] Juline Patch, MD  ? 8 months ago Annual physical exam  ? [facility] Juline Patch, MD  ? 1 year ago Annual physical exam  ? [facility] Juline Patch, MD  ? 2 years ago Essential hypertension  ? [facility] Juline Patch, MD  ? 2 years ago Essential hypertension  ? [facility] Juline Patch, MD  ?  ?  ?Future Appointments   ?        ? In 3 months Juline Patch, MD [facility], [place]  ? In 4 months Juline Patch, MD [facility]  ?  ? ?  ?  ?  Passed - Last BP in normal range  ?  BP Readings from Last 1 Encounters:  ?07/29/21 110/86  ?   ?  ?  ?? atorvastatin (LIPITOR) 10 MG tablet [Pharmacy Med Name: ATORVASTATIN 10 MG TABLET] 90 tablet 1  ?  Sig: TAKE 1 TABLET BY MOUTH EVERY DAY  ?  ? Cardiovascular:  Antilipid - Statins Failed - 03/26/2022  9:17 AM  ?  ?  Failed - Lipid Panel in normal range within the last 12 months  ?  Cholesterol, Total  ?Date Value Ref Range Status  ?07/09/2021 284 (H) 100 - 199 mg/dL Final  ? ?LDL Chol Calc (NIH)  ?Date Value Ref Range Status   ?07/09/2021 180 (H) 0 - 99 mg/dL Final  ? ?HDL  ?Date Value Ref Range Status  ?07/09/2021 73 >39 mg/dL Final  ? ?Triglycerides  ?Date Value Ref Range Status  ?07/09/2021 173 (H) 0 - 149 mg/dL Final  ? ?  ?  ?  Passed - Patient is not pregnant  ?  ?  Passed - Valid encounter within last 12 months  ?  Recent Outpatient Visits   ?      ? 8 months ago Essential hypertension  ? [facility] Juline Patch, MD  ? 8 months ago Annual physical exam  ? [facility] Juline Patch, MD  ? 1 year ago Annual physical exam  ? [facility] Juline Patch, MD  ? 2 years ago Essential hypertension  ? [facility] Juline Patch, MD  ? 2 years ago Essential hypertension  ? [facility] O[facility] Miu  C, MD  ?  ?  ?Future Appointments   ?        ? In 3 months Juline Patch, MD [facility], [place]  ? In 4 months Juline Patch, MD [facility], [place]  ?  ? ?  ?  ?  ? ?

## 2022-05-23 ENCOUNTER — Other Ambulatory Visit: Payer: Self-pay | Admitting: Family Medicine

## 2022-05-23 DIAGNOSIS — J449 Chronic obstructive pulmonary disease, unspecified: Secondary | ICD-10-CM

## 2022-05-24 NOTE — Telephone Encounter (Signed)
Requested by interface surescripts. Medication discontinued 07/07/16. Requested Prescriptions  Refused Prescriptions Disp Refills  . albuterol (VENTOLIN HFA) 108 (90 Base) MCG/ACT inhaler [Pharmacy Med Name: ALBUTEROL HFA (PROVENTIL) INH] 6.7 each 11    Sig: TAKE 2 PUFFS BY MOUTH EVERY 6 HOURS AS NEEDED FOR WHEEZE OR SHORTNESS OF BREATH     Pulmonology:  [facility] 2 Passed - 05/23/2022  2:43 PM      Passed - Last BP in normal range    BP Readings from Last 1 Encounters:  07/29/21 110/86         Passed - Last Heart Rate in normal range    Pulse Readings from Last 1 Encounters:  07/29/21 80         Passed - Valid encounter within last 12 months    Recent Outpatient Visits          9 months ago Essential hypertension   [place], Deanna C, MD   10 months ago Annual physical exam   [facility] Juline Patch, MD   1 year ago Annual physical exam   [facility] Juline Patch, MD   2 years ago Essential hypertension   [place], Deanna C, MD   2 years ago Essential hypertension   [place], Deanna C, MD      Future Appointments            In 1 month Juline Patch, MD [facility], [place]   In 2 months Juline Patch, MD [facility], [facility]

## 2022-06-20 ENCOUNTER — Other Ambulatory Visit: Payer: Self-pay | Admitting: Family Medicine

## 2022-06-20 DIAGNOSIS — I10 Essential (primary) hypertension: Secondary | ICD-10-CM

## 2022-06-20 DIAGNOSIS — E782 Mixed hyperlipidemia: Secondary | ICD-10-CM

## 2022-06-30 ENCOUNTER — Other Ambulatory Visit: Payer: Self-pay | Admitting: Family Medicine

## 2022-06-30 DIAGNOSIS — I10 Essential (primary) hypertension: Secondary | ICD-10-CM

## 2022-06-30 DIAGNOSIS — E782 Mixed hyperlipidemia: Secondary | ICD-10-CM

## 2022-07-14 ENCOUNTER — Ambulatory Visit (INDEPENDENT_AMBULATORY_CARE_PROVIDER_SITE_OTHER): Payer: Commercial Managed Care - PPO | Admitting: Family Medicine

## 2022-07-14 ENCOUNTER — Encounter: Payer: Self-pay | Admitting: Family Medicine

## 2022-07-14 VITALS — BP 142/98 | HR 68 | Ht 61.0 in | Wt 159.0 lb

## 2022-07-14 DIAGNOSIS — Z23 Encounter for immunization: Secondary | ICD-10-CM | POA: Diagnosis not present

## 2022-07-14 DIAGNOSIS — I10 Essential (primary) hypertension: Secondary | ICD-10-CM

## 2022-07-14 DIAGNOSIS — J449 Chronic obstructive pulmonary disease, unspecified: Secondary | ICD-10-CM | POA: Diagnosis not present

## 2022-07-14 DIAGNOSIS — F410 Panic disorder [episodic paroxysmal anxiety] without agoraphobia: Secondary | ICD-10-CM

## 2022-07-14 DIAGNOSIS — F411 Generalized anxiety disorder: Secondary | ICD-10-CM | POA: Diagnosis not present

## 2022-07-14 DIAGNOSIS — E782 Mixed hyperlipidemia: Secondary | ICD-10-CM

## 2022-07-14 MED ORDER — ATORVASTATIN CALCIUM 10 MG PO TABS: 10.0000 mg | ORAL_TABLET | Freq: Every day | ORAL | 1 refills | Status: DC

## 2022-07-14 MED ORDER — ALBUTEROL SULFATE HFA 108 (90 BASE) MCG/ACT IN AERS: 2.0000 | INHALATION_SPRAY | Freq: Four times a day (QID) | RESPIRATORY_TRACT | 11 refills | Status: DC | PRN

## 2022-07-14 MED ORDER — HYDROCHLOROTHIAZIDE 12.5 MG PO TABS: 12.5000 mg | ORAL_TABLET | Freq: Every day | ORAL | 1 refills | Status: DC

## 2022-07-14 MED ORDER — AMLODIPINE BESYLATE 2.5 MG PO TABS: 2.5000 mg | ORAL_TABLET | Freq: Every day | ORAL | 1 refills | Status: DC

## 2022-07-14 MED ORDER — SERTRALINE HCL 25 MG PO TABS: 25.0000 mg | ORAL_TABLET | Freq: Every day | ORAL | 3 refills | Status: DC

## 2022-07-14 MED ORDER — HYDROXYZINE HCL 10 MG PO TABS: 10.0000 mg | ORAL_TABLET | ORAL | 0 refills | Status: DC | PRN
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
## 2022-07-15 LAB — LIPID PANEL WITH LDL/HDL RATIO
Cholesterol, Total: 181 mg/dL (ref 100–199)
HDL: 69 mg/dL (ref >39)
LDL Chol Calc (NIH): 66 mg/dL (ref 0–99)
LDL/HDL Ratio: 1 ratio (ref 0.0–3.2)
Triglycerides: 294 mg/dL — ABNORMAL HIGH (ref 0–149)
VLDL Cholesterol Cal: 46 mg/dL — ABNORMAL HIGH (ref 5–40)

## 2022-07-15 LAB — COMPREHENSIVE METABOLIC PANEL
ALT: 15 IU/L (ref 0–32)
AST: 15 IU/L (ref 0–40)
Albumin/Globulin Ratio: 1.7 (ref 1.2–2.2)
Albumin: 4.4 g/dL (ref 3.8–4.9)
Alkaline Phosphatase: 76 IU/L (ref 44–121)
BUN/Creatinine Ratio: 15 (ref 9–23)
BUN: 19 mg/dL (ref 6–24)
Bilirubin Total: 0.3 mg/dL (ref 0.0–1.2)
CO2: 22 mmol/L (ref 20–29)
Calcium: 9.8 mg/dL (ref 8.7–10.2)
Chloride: 100 mmol/L (ref 96–106)
Creatinine, Ser: 1.3 mg/dL — ABNORMAL HIGH (ref 0.57–1.00)
Globulin, Total: 2.6 g/dL (ref 1.5–4.5)
Glucose: 100 mg/dL — ABNORMAL HIGH (ref 70–99)
Potassium: 3.4 mmol/L — ABNORMAL LOW (ref 3.5–5.2)
Sodium: 140 mmol/L (ref 134–144)
Total Protein: 7 g/dL (ref 6.0–8.5)
eGFR: 48 mL/min/{1.73_m2} — ABNORMAL LOW (ref >59)

## 2022-08-02 ENCOUNTER — Ambulatory Visit: Payer: Commercial Managed Care - PPO | Admitting: Family Medicine

## 2022-08-03 ENCOUNTER — Ambulatory Visit: Payer: Commercial Managed Care - PPO | Admitting: Family Medicine

## 2022-08-04 ENCOUNTER — Ambulatory Visit: Payer: Commercial Managed Care - PPO | Admitting: Family Medicine

## 2022-08-04 ENCOUNTER — Encounter: Payer: Self-pay | Admitting: Family Medicine

## 2022-08-04 VITALS — BP 102/62 | HR 72 | Ht 61.0 in | Wt 167.0 lb

## 2022-08-04 DIAGNOSIS — I10 Essential (primary) hypertension: Secondary | ICD-10-CM | POA: Diagnosis not present

## 2022-08-04 DIAGNOSIS — F410 Panic disorder [episodic paroxysmal anxiety] without agoraphobia: Secondary | ICD-10-CM

## 2022-08-04 DIAGNOSIS — F411 Generalized anxiety disorder: Secondary | ICD-10-CM | POA: Diagnosis not present

## 2022-08-04 IMAGING — MG MM DIGITAL DIAGNOSTIC UNILAT*L* W/ TOMO W/ CAD
4 series · 4 of 12 positions shown · non-contrast
Comparison: Previous exam(s).

CLINICAL DATA: Retroareolar asymmetry callback

EXAM:
DIGITAL DIAGNOSTIC UNILATERAL LEFT MAMMOGRAM WITH TOMOSYNTHESIS AND
CAD; ULTRASOUND LEFT BREAST LIMITED
TECHNIQUE: Left digital diagnostic mammography and breast tomosynthesis was
performed. The images were evaluated with computer-aided detection.;
Targeted ultrasound examination of the left breast was performed.

[L MLO synth-2D]
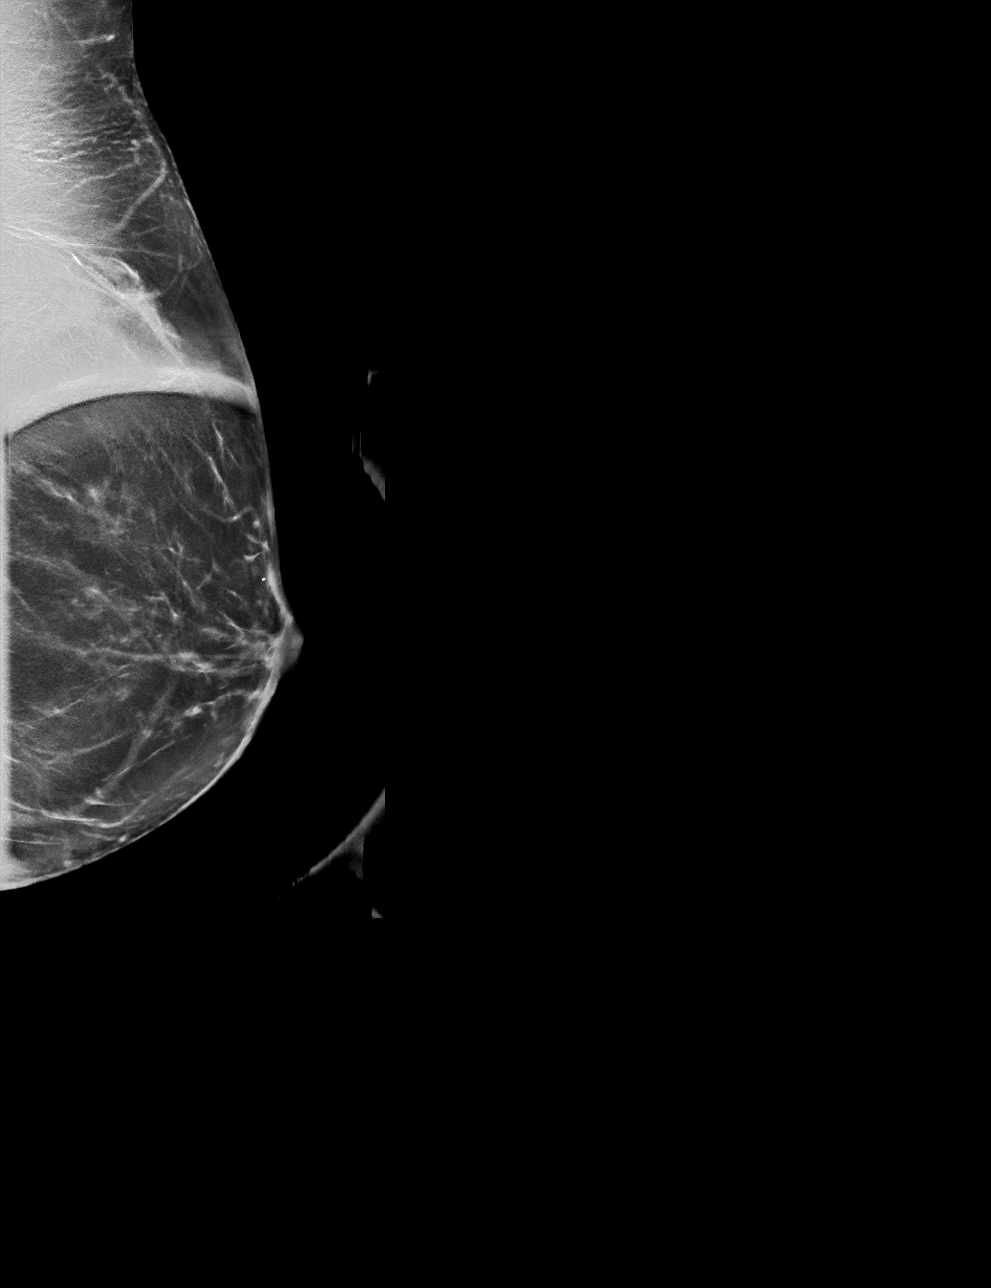

[L CC synth-2D]
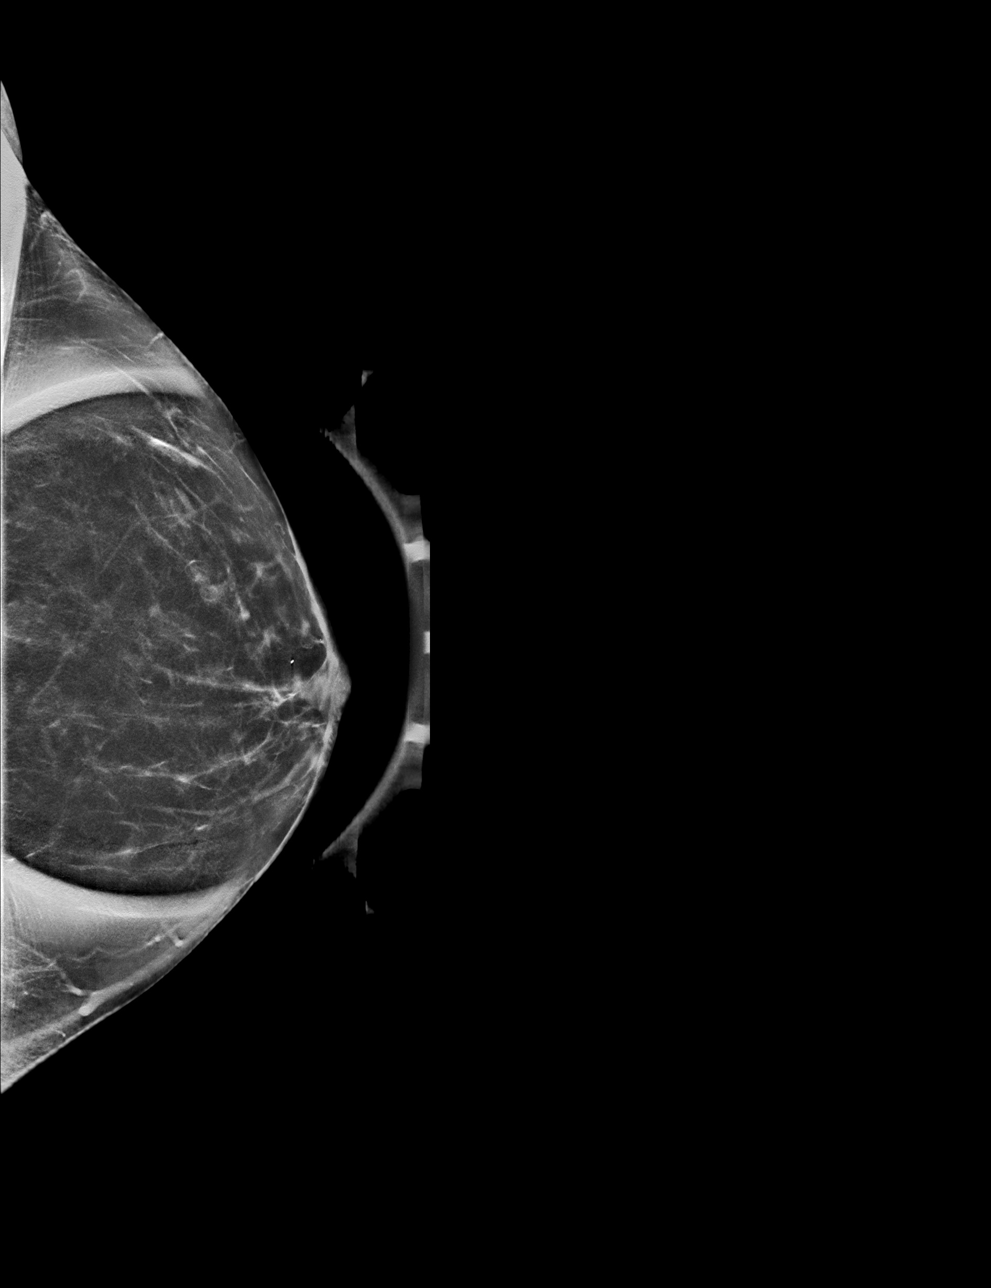

[L MLO tomo · tomo slice 32/63.0]
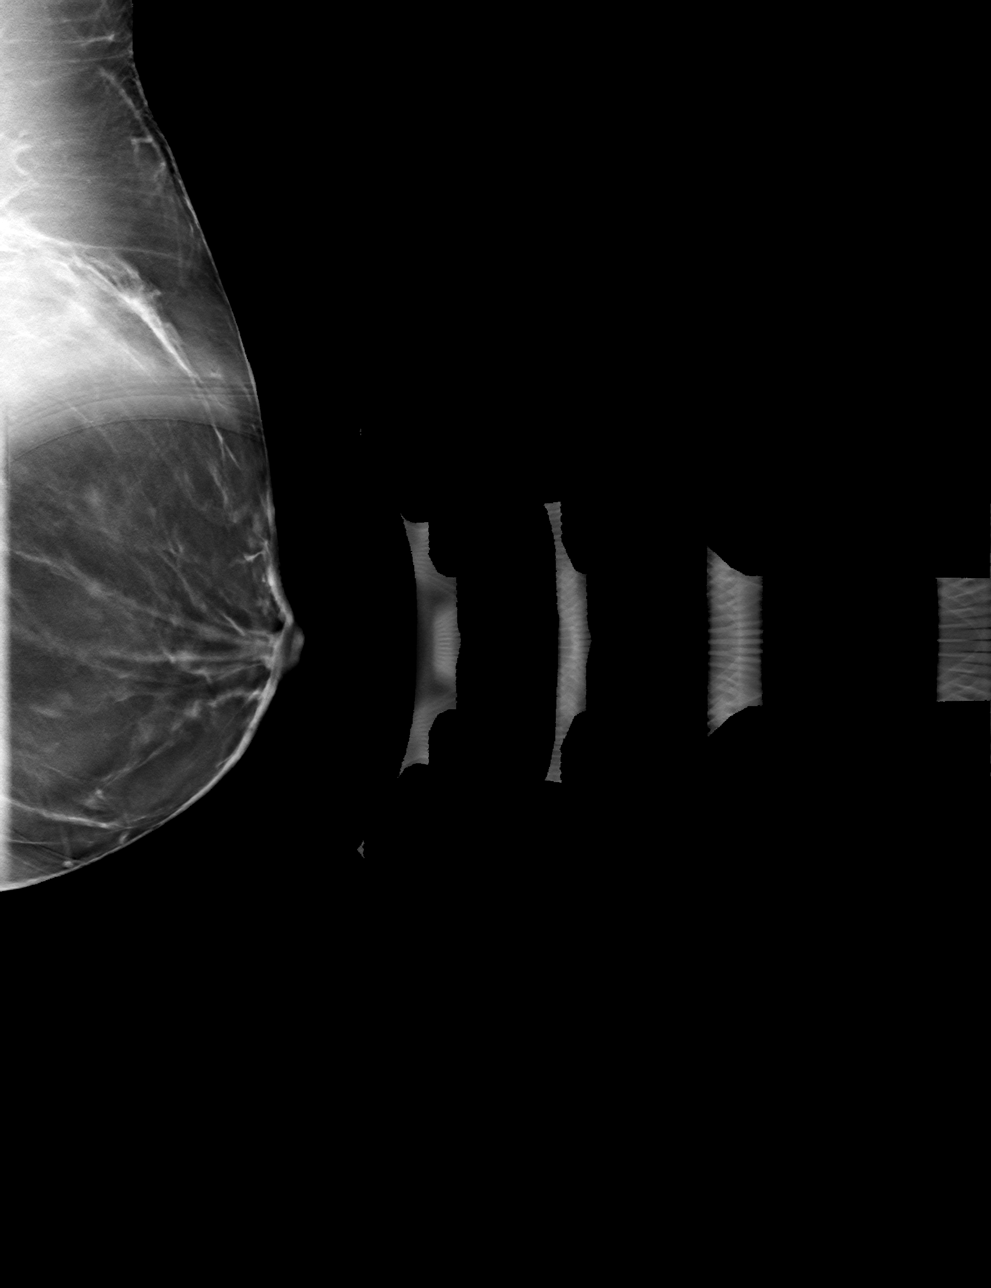

[L CC tomo · tomo slice 29/58.0]
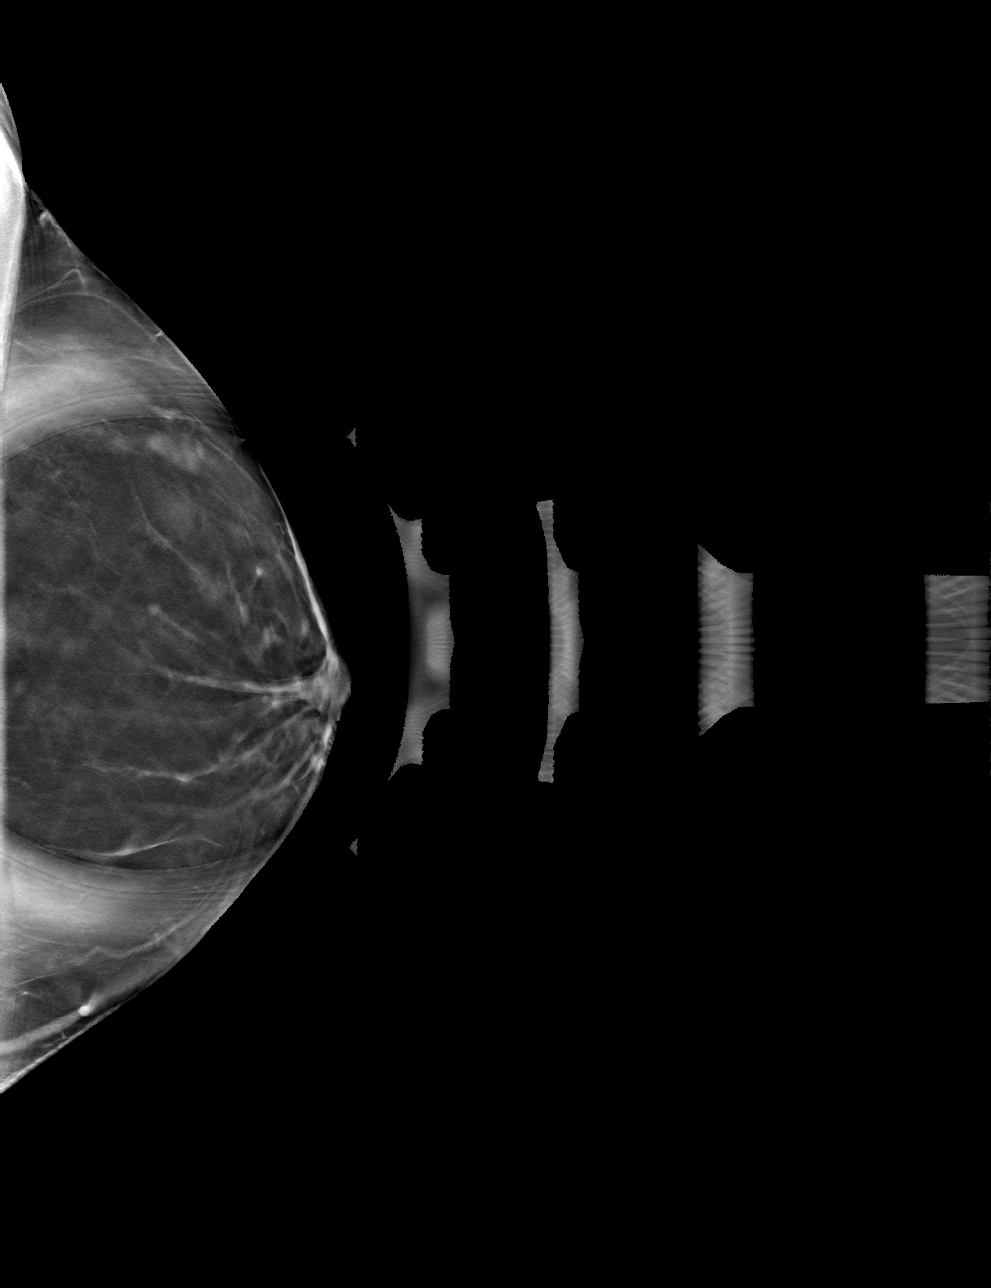

[4 of 12 positions shown; findings below may reference images not displayed]

ACR Breast Density Category b: There are scattered areas of
fibroglandular density.
FINDINGS: The previously described finding does not persist with additional
views, consistent with superimposed fibroglandular tissue. No
suspicious mass, microcalcification, or other finding is identified.
Due to retroareolar location, targeted ultrasound was performed.

On physical exam, no suspicious mass is appreciated.

Targeted LEFT retroareolar ultrasound was performed. No suspicious
cystic or solid mass is seen at the site of screening mammographic
concern.
IMPRESSION: No mammographic or sonographic evidence of malignancy at the site of
screening mammographic concern.

RECOMMENDATION:
Screening mammogram in one year.(Code:BN-L-OB4)

I have discussed the findings and recommendations with the patient.
If applicable, a reminder letter will be sent to the patient
regarding the next appointment.

BI-RADS CATEGORY  1: Negative.

## 2022-08-04 MED ORDER — HYDROXYZINE HCL 10 MG PO TABS: 10.0000 mg | ORAL_TABLET | ORAL | 1 refills | Status: DC | PRN

## 2022-08-04 NOTE — Progress Notes (Signed)
Date:  08/04/2022   Name:  Tamara Olson   DOB:  28-Feb-1966   MRN:  [id number]   Chief Complaint: Hypertension (Added amlodipine) and Anxiety (Added hydroxyzine)  Hypertension This is a chronic problem. The current episode started more than 1 year ago. The problem has been gradually improving since onset. The problem is controlled. Associated symptoms include anxiety. Pertinent negatives include no blurred vision, chest pain, malaise/fatigue, orthopnea, palpitations, peripheral edema, PND or sweats. Past treatments include calcium channel blockers and diuretics. The current treatment provides moderate improvement. There are no compliance problems.  There is no history of angina, kidney disease, CAD/MI, CVA, heart failure, left ventricular hypertrophy, PVD or retinopathy. There is no history of chronic renal disease, a hypertension causing med or renovascular disease.  Anxiety Presents for follow-up visit. Symptoms include nervous/anxious behavior. Patient reports no chest pain, irritability or palpitations.      Lab Results  Component Value Date   NA 140 07/14/2022   K 3.4 (L) 07/14/2022   CO2 22 07/14/2022   GLUCOSE 100 (H) 07/14/2022   BUN 19 07/14/2022   CREATININE 1.30 (H) 07/14/2022   CALCIUM 9.8 07/14/2022   EGFR 48 (L) 07/14/2022   GFRNONAA 69 07/07/2020   Lab Results  Component Value Date   CHOL 181 07/14/2022   HDL 69 07/14/2022   LDLCALC 66 07/14/2022   TRIG 294 (H) 07/14/2022   CHOLHDL 3.1 06/27/2018   Lab Results  Component Value Date   TSH 4.090 07/07/2020   Lab Results  Component Value Date   HGBA1C 5.5 06/01/2015   Lab Results  Component Value Date   WBC 8.6 06/01/2015   HGB 14.3 06/01/2015   HCT 43.5 06/01/2015   MCV 96 06/01/2015   PLT 329 06/01/2015   Lab Results  Component Value Date   ALT 15 07/14/2022   AST 15 07/14/2022   ALKPHOS 76 07/14/2022   BILITOT 0.3 07/14/2022   Lab Results  Component Value Date   VD25OH 41.5 01/01/2016      Review of Systems  Constitutional:  Negative for fever, irritability and malaise/fatigue.  HENT:  Negative for trouble swallowing.   Eyes:  Negative for blurred vision and visual disturbance.  Respiratory:  Negative for chest tightness.   Cardiovascular:  Negative for chest pain, palpitations, orthopnea, leg swelling and PND.  Gastrointestinal:  Negative for abdominal distention.  Genitourinary:  Negative for difficulty urinating.  Musculoskeletal:  Negative for back pain.  Hematological:  Negative for adenopathy. Does not bruise/bleed easily.  Psychiatric/Behavioral:  The patient is nervous/anxious.     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   Intermittent low back pain 06/01/2015    No Known Allergies  Past Surgical History:  Procedure Laterality Date   ABDOMINAL HYSTERECTOMY  2009   APPENDECTOMY  1990's   CESAREAN SECTION  1989   DENTAL SURGERY     pulled top teeth- has dentures   TONSILLECTOMY  2010    Social History   Tobacco Use   Smoking status: Every Day    Packs/day: 1.00    Years: 30.00    Total pack years: 30.00    Types: Cigarettes   Smokeless tobacco: Never   Tobacco comments:  Patient not ready to quit yet  Vaping Use   Vaping Use: Never used  Substance Use Topics   Alcohol use: Yes    Alcohol/week: 2.0 - 4.0 standard drinks of alcohol    Types: 2 - 4 Cans of beer per week    Comment: 2-4 q day   Drug use: No     Medication list has been reviewed and updated.  Current Meds  Medication Sig   albuterol (PROAIR HFA) 108 (90 Base) MCG/ACT inhaler Inhale 2 puffs into the lungs every 6 (six) hours as needed for wheezing or shortness of breath.   amLODipine  (NORVASC) 2.5 MG tablet Take 1 tablet (2.5 mg total) by mouth daily.   atorvastatin (LIPITOR) 10 MG tablet Take 1 tablet (10 mg total) by mouth daily.   fexofenadine (ALLEGRA) 180 MG tablet Take 180 mg by mouth daily. otc   hydrochlorothiazide (HYDRODIURIL) 12.5 MG tablet Take 1 tablet (12.5 mg total) by mouth daily.   hydrOXYzine (ATARAX) 10 MG tablet Take 1 tablet (10 mg total) by mouth as needed. Prn panic   sertraline (ZOLOFT) 25 MG tablet Take 1 tablet (25 mg total) by mouth daily.       08/04/2022    1:46 PM 07/14/2022    8:50 AM 07/09/2021    8:41 AM 07/07/2020    8:39 AM  GAD 7 : Generalized Anxiety Score  Nervous, Anxious, on Edge 0 1 0 0  Control/stop worrying 0 0 0 0  Worry too much - different things 0 0 0 0  Trouble relaxing 0 0 0 0  Restless 0 0 0 0  Easily annoyed or irritable 0 0 0 0  Afraid - awful might happen 0 0 0 0  Total GAD 7 Score 0 1 0 0  Anxiety Difficulty Not difficult at all Not difficult at all         08/04/2022    1:46 PM 07/14/2022    8:49 AM 07/09/2021    8:41 AM  Depression screen PHQ 2/9  Decreased Interest 0 0 0  Down, Depressed, Hopeless 0 0 0  PHQ - 2 Score 0 0 0  Altered sleeping 0 0 0  Tired, decreased energy 0 0 0  Change in appetite 0 0 0  Feeling bad or failure about yourself  0 0 0  Trouble concentrating 0 0 0  Moving slowly or fidgety/restless 0 1 0  Suicidal thoughts 0 0 0  PHQ-9 Score 0 1 0  Difficult doing work/chores Not difficult at all Not difficult at all     BP Readings from Last 3 Encounters:  08/04/22 102/62  07/14/22 (!) 142/98  07/29/21 110/86    Physical Exam Vitals and nursing note reviewed. Exam conducted with a chaperone present.  Constitutional:      General: She is not in acute distress.    Appearance: She is not diaphoretic.  HENT:     Head: Normocephalic and atraumatic.     Right Ear: External ear normal.     Left Ear: External ear normal.     Nose: Nose normal.     Mouth/Throat:     Mouth: Mucous  membranes are moist.  Eyes:     General:        Right eye: No discharge.        Left eye: No discharge.     Conjunctiva/sclera: Conjunctivae normal.     Pupils: Pupils are equal, round, and reactive to light.  Neck:  Thyroid: No thyromegaly.     Vascular: No JVD.  Cardiovascular:     Rate and Rhythm: Normal rate and regular rhythm.     Heart sounds: Normal heart sounds. No murmur heard.    No friction rub. No gallop.  Pulmonary:     Effort: Pulmonary effort is normal.     Breath sounds: Normal breath sounds.  Abdominal:     General: Bowel sounds are normal.     Palpations: Abdomen is soft. There is no mass.     Tenderness: There is no abdominal tenderness. There is no guarding.  Musculoskeletal:        General: Normal range of motion.     Cervical back: Normal range of motion and neck supple.  Lymphadenopathy:     Cervical: No cervical adenopathy.  Skin:    General: Skin is warm and dry.  Neurological:     Mental Status: She is alert.     Deep Tendon Reflexes: Reflexes are normal and symmetric.     Wt Readings from Last 3 Encounters:  08/04/22 167 lb (75.8 kg)  07/14/22 159 lb (72.1 kg)  07/29/21 160 lb (72.6 kg)    BP 102/62   Pulse 72   Ht 5' 1"  (1.549 m)   Wt 167 lb (75.8 kg)   BMI 31.55 kg/m   Assessment and Plan:  1. Generalized anxiety disorder with panic attacks Chronic.  Controlled.  Stable.  Patient has been given hydroxyzine to use on a as needed basis for anxiety/panic situations.  We will refill and will recheck in 6 months. - hydrOXYzine (ATARAX) 10 MG tablet; Take 1 tablet (10 mg total) by mouth as needed. Prn panic  Dispense: 90 tablet; Refill: 1  2. Essential hypertension Chronic.  Controlled.  Stable.  Blood pressure with the addition of amlodipine is 102/62.  Continue amlodipine hydrochlorothiazide at current dosing.   Otilio Miu, MD

## 2022-08-05 ENCOUNTER — Other Ambulatory Visit: Payer: Self-pay | Admitting: Family Medicine

## 2022-08-05 DIAGNOSIS — F410 Panic disorder [episodic paroxysmal anxiety] without agoraphobia: Secondary | ICD-10-CM

## 2022-10-23 ENCOUNTER — Other Ambulatory Visit: Payer: Self-pay | Admitting: Family Medicine

## 2022-10-23 DIAGNOSIS — F411 Generalized anxiety disorder: Secondary | ICD-10-CM

## 2023-01-22 ENCOUNTER — Other Ambulatory Visit: Payer: Self-pay | Admitting: Family Medicine

## 2023-01-22 DIAGNOSIS — E782 Mixed hyperlipidemia: Secondary | ICD-10-CM

## 2023-01-22 DIAGNOSIS — F410 Panic disorder [episodic paroxysmal anxiety] without agoraphobia: Secondary | ICD-10-CM

## 2023-01-22 DIAGNOSIS — I10 Essential (primary) hypertension: Secondary | ICD-10-CM

## 2023-01-23 NOTE — Telephone Encounter (Signed)
Requested medication (s) are due for refill today: yes  Requested medication (s) are on the active medication list: yes  Last refill:  07/14/22 #90 1 RF  Future visit scheduled: yes  Notes to clinic:  pt has had multiple cancellations was not sure the amount to refill Requested Prescriptions  Pending Prescriptions Disp Refills   amLODipine (NORVASC) 2.5 MG tablet [Pharmacy Med Name: AMLODIPINE BESYLATE 2.5 MG TAB] 90 tablet     Sig: TAKE 1 TABLET BY MOUTH EVERY DAY     Cardiovascular: Calcium Channel Blockers 2 Passed - 01/22/2023  8:39 AM      Passed - Last BP in normal range    BP Readings from Last 1 Encounters:  08/04/22 102/62         Passed - Last Heart Rate in normal range    Pulse Readings from Last 1 Encounters:  08/04/22 72         Passed - Valid encounter within last 6 months    Recent Outpatient Visits           5 months ago Generalized anxiety disorder with panic attacks   [place] [facility] at [place], Deanna C, MD   6 months ago Essential hypertension   [place] at [place], Deanna C, MD   1 year ago Essential hypertension   [place] at [place], Deanna C, MD   1 year ago Annual physical exam   [place] at [place], Deanna C, MD   2 years ago Annual physical exam   [place] at [place], [place], MD       Future Appointments             In 1 week Juline Patch, MD [place] at [facility], [place]   In 6 months Juline Patch, MD [place] at [facility], [facility]            Signed Prescriptions Disp Refills   hydrochlorothiazide (HYDRODIURIL) 12.5 MG tablet 90 tablet 0    Sig: TAKE 1 TABLET BY MOUTH EVERY DAY     Cardiovascular: Diuretics - Thiazide  Failed - 01/22/2023  8:39 AM      Failed - Cr in normal range and within 180 days    Creatinine, Ser  Date Value Ref Range Status  07/14/2022 1.30 (H) 0.57 - 1.00 mg/dL Final         Failed - K in normal range and within 180 days    Potassium  Date Value Ref Range Status  07/14/2022 3.4 (L) 3.5 - 5.2 mmol/L Final         Failed - Na in normal range and within 180 days    Sodium  Date Value Ref Range Status  07/14/2022 140 134 - 144 mmol/L Final         Passed - Last BP in normal range    BP Readings from Last 1 Encounters:  08/04/22 102/62         Passed - Valid encounter within last 6 months    Recent Outpatient Visits           5 months ago Generalized anxiety disorder with panic attacks   [place]  [facility] at [facility]  [place], MD   6 months ago Essential hypertension   [place] [facility] at [place], Deanna C, MD   1 year ago Essential hypertension   [place] at [place], Deanna C, MD   1 year ago Annual physical exam   [facility] at [place], Deanna C, MD   2 years ago Annual physical exam   [place] at [place], [place], MD       Future Appointments             In 1 week Juline Patch, MD [place] at [facility], [place]   In 6 months Juline Patch, MD [place] at [facility], [facility]             sertraline (ZOLOFT) 25 MG tablet 90 tablet 0    Sig: TAKE 1 TABLET (25 MG TOTAL) BY MOUTH DAILY.     Psychiatry:  Antidepressants - SSRI - sertraline Passed - 01/22/2023  8:39 AM      Passed - AST in normal range and within 360 days    AST  Date Value Ref Range Status  07/14/2022 15 0 - 40 IU/L Final         Passed - ALT in normal range and within 360 days    ALT   Date Value Ref Range Status  07/14/2022 15 0 - 32 IU/L Final         Passed - Completed PHQ-2 or PHQ-9 in the last 360 days      Passed - Valid encounter within last 6 months    Recent Outpatient Visits           5 months ago Generalized anxiety disorder with panic attacks    [facility] at [place], Deanna C, MD   6 months ago Essential hypertension   [place] at [place], Deanna C, MD   1 year ago Essential hypertension   [place] at [place], Deanna C, MD   1 year ago Annual physical exam   [facility] at [place], Deanna C, MD   2 years ago Annual physical exam   [place] at [place], [place], MD       Future Appointments             In 1 week Juline Patch, MD [place] at [facility], [place]   In 6 months Juline Patch, MD [place] at [facility], [facility]             atorvastatin (LIPITOR) 10 MG tablet 90 tablet 1    Sig: TAKE 1 TABLET BY MOUTH EVERY DAY     Cardiovascular:  Antilipid - Statins Failed - 01/22/2023  8:39 AM      Failed - Lipid Panel in normal range within the last 12 months    Cholesterol, Total  Date Value Ref Range Status  07/14/2022 181 100 - 199 mg/dL Final   LDL Chol Calc (NIH)  Date Value Ref Range Status  07/14/2022 66 0 -  99 mg/dL Final   HDL  Date Value Ref Range Status  07/14/2022 69 >39 mg/dL Final   Triglycerides  Date Value Ref Range Status  07/14/2022 294 (H) 0 - 149 mg/dL Final         Passed - Patient is not pregnant      Passed - Valid encounter within last 12 months    Recent Outpatient Visits           5 months ago Generalized anxiety disorder with panic attacks    [place] at  [place], [place], MD   6 months ago Essential hypertension   [place] at [place], Deanna C, MD   1 year ago Essential hypertension   [place] at [place], Deanna C, MD   1 year ago Annual physical exam   [facility] at [place], Deanna C, MD   2 years ago Annual physical exam   [place] at [place], [place], MD       Future Appointments             In 1 week Juline Patch, MD [place] at [facility], [place]   In 6 months Juline Patch, MD [place] at [facility], [facility]

## 2023-01-23 NOTE — Telephone Encounter (Signed)
Requested Prescriptions  Pending Prescriptions Disp Refills   hydrochlorothiazide (HYDRODIURIL) 12.5 MG tablet [Pharmacy Med Name: HYDROCHLOROTHIAZIDE 12.5 MG TB] 90 tablet 0    Sig: TAKE 1 TABLET BY MOUTH EVERY DAY     Cardiovascular: Diuretics - Thiazide Failed - 01/22/2023  8:39 AM      Failed - Cr in normal range and within 180 days    Creatinine, Ser  Date Value Ref Range Status  07/14/2022 1.30 (H) 0.57 - 1.00 mg/dL Final         Failed - K in normal range and within 180 days    Potassium  Date Value Ref Range Status  07/14/2022 3.4 (L) 3.5 - 5.2 mmol/L Final         Failed - Na in normal range and within 180 days    Sodium  Date Value Ref Range Status  07/14/2022 140 134 - 144 mmol/L Final         Passed - Last BP in normal range    BP Readings from Last 1 Encounters:  08/04/22 102/62         Passed - Valid encounter within last 6 months    Recent Outpatient Visits           5 months ago Generalized anxiety disorder with panic attacks   [place] [facility] at [place], [place], MD   6 months ago Essential hypertension   [place] [facility] at [place], Deanna C, MD   1 year ago Essential hypertension   [place] [facility] at [place], Deanna C, MD   1 year ago Annual physical exam   [facility] at [place], Deanna C, MD   2 years ago Annual physical exam   [facility] at [place], [place], MD       Future Appointments             In 1 week Juline Patch, MD [facility] at [facility], [place]   In 6 months Juline Patch, MD [facility] at [facility], [facility]             sertraline (ZOLOFT) 25 MG tablet [Pharmacy Med Name: SERTRALINE HCL 25 MG TABLET] 90 tablet 0    Sig: TAKE 1 TABLET  (25 MG TOTAL) BY MOUTH DAILY.     Psychiatry:  Antidepressants - SSRI - sertraline Passed - 01/22/2023  8:39 AM      Passed - AST in normal range and within 360 days    AST  Date Value Ref Range Status  07/14/2022 15 0 - 40 IU/L Final         Passed - ALT in normal range and within 360 days    ALT  Date Value Ref Range Status  07/14/2022 15 0 - 32 IU/L Final         Passed - Completed PHQ-2 or PHQ-9 in the last 360 days      Passed - Valid encounter within last 6 months    Recent Outpatient Visits           5 months ago Generalized anxiety disorder with panic attacks   [place] [facility] at [place], Deanna C, MD   6 months ago Essential  hypertension   [place] [facility] at [place], Deanna C, MD   1 year ago Essential hypertension   [place] [facility] at [place], Deanna C, MD   1 year ago Annual physical exam   [facility] at [place], Deanna C, MD   2 years ago Annual physical exam   [facility] at [place], [place], MD       Future Appointments             In 1 week Juline Patch, MD [place] at [facility], [place]   In 6 months Juline Patch, MD [place] at [facility], [place]             atorvastatin (LIPITOR) 10 MG tablet [Pharmacy Med Name: ATORVASTATIN 10 MG TABLET] 90 tablet 1    Sig: TAKE 1 TABLET BY MOUTH EVERY DAY     Cardiovascular:  Antilipid - Statins Failed - 01/22/2023  8:39 AM      Failed - Lipid Panel in normal range within the last 12 months    Cholesterol, Total  Date Value Ref Range Status  07/14/2022 181 100 - 199 mg/dL Final   LDL Chol Calc (NIH)  Date Value Ref Range Status  07/14/2022 66 0 - 99 mg/dL Final   HDL  Date Value Ref Range Status  07/14/2022 69  >39 mg/dL Final   Triglycerides  Date Value Ref Range Status  07/14/2022 294 (H) 0 - 149 mg/dL Final         Passed - Patient is not pregnant      Passed - Valid encounter within last 12 months    Recent Outpatient Visits           5 months ago Generalized anxiety disorder with panic attacks   [place] [facility] [place] at [place], Deanna C, MD   6 months ago Essential hypertension   [place] at [place], Deanna C, MD   1 year ago Essential hypertension   [place] at [place], Deanna C, MD   1 year ago Annual physical exam   [place] at [place], Deanna C, MD   2 years ago Annual physical exam   [place] at [place], [place], MD       Future Appointments             In 1 week Juline Patch, MD [place] at [facility], [place]   In 6 months Juline Patch, MD [place] at [place], [facility]             amLODipine (NORVASC) 2.5 MG tablet [Pharmacy Med Name: AMLODIPINE BESYLATE 2.5 MG TAB] 90 tablet     Sig: TAKE 1 TABLET BY MOUTH EVERY DAY     Cardiovascular: Calcium Channel Blockers 2 Passed - 01/22/2023  8:39 AM      Passed - Last BP in normal range    BP Readings from Last 1 Encounters:  08/04/22 102/62         Passed - Last Heart Rate  in normal range    Pulse Readings from Last 1 Encounters:  08/04/22 72         Passed - Valid encounter within last 6 months    Recent Outpatient Visits           5 months ago Generalized anxiety disorder with panic attacks   [place] [facility] at [place], Deanna C, MD   6 months ago Essential hypertension    [facility] Medicine at [place], Deanna C, MD   1  year ago Essential hypertension   [place] at [place], Deanna C, MD   1 year ago Annual physical exam   [facility] at [place], Deanna C, MD   2 years ago Annual physical exam   [place] at [place], [place], MD       Future Appointments             In 1 week Juline Patch, MD [place] at [facility], [place]   In 6 months Juline Patch, MD [place] at [facility], [facility]

## 2023-02-02 ENCOUNTER — Ambulatory Visit: Payer: Commercial Managed Care - PPO | Admitting: Family Medicine

## 2023-02-02 ENCOUNTER — Encounter: Payer: Self-pay | Admitting: Family Medicine

## 2023-02-02 ENCOUNTER — Telehealth: Payer: Self-pay

## 2023-02-02 VITALS — BP 120/70 | HR 84 | Ht 61.0 in | Wt 164.0 lb

## 2023-02-02 DIAGNOSIS — F411 Generalized anxiety disorder: Secondary | ICD-10-CM

## 2023-02-02 DIAGNOSIS — J449 Chronic obstructive pulmonary disease, unspecified: Secondary | ICD-10-CM | POA: Diagnosis not present

## 2023-02-02 DIAGNOSIS — I10 Essential (primary) hypertension: Secondary | ICD-10-CM | POA: Diagnosis not present

## 2023-02-02 DIAGNOSIS — F410 Panic disorder [episodic paroxysmal anxiety] without agoraphobia: Secondary | ICD-10-CM

## 2023-02-02 DIAGNOSIS — E782 Mixed hyperlipidemia: Secondary | ICD-10-CM

## 2023-02-02 MED ORDER — ALBUTEROL SULFATE HFA 108 (90 BASE) MCG/ACT IN AERS: 2.0000 | INHALATION_SPRAY | Freq: Four times a day (QID) | RESPIRATORY_TRACT | 11 refills | Status: DC | PRN

## 2023-02-02 MED ORDER — SERTRALINE HCL 25 MG PO TABS: 25.0000 mg | ORAL_TABLET | Freq: Every day | ORAL | 1 refills | Status: DC

## 2023-02-02 MED ORDER — AMLODIPINE BESYLATE 2.5 MG PO TABS: 2.5000 mg | ORAL_TABLET | Freq: Every day | ORAL | 1 refills | Status: DC

## 2023-02-02 MED ORDER — HYDROXYZINE HCL 10 MG PO TABS: 10.0000 mg | ORAL_TABLET | ORAL | 1 refills | Status: DC | PRN

## 2023-02-02 MED ORDER — HYDROCHLOROTHIAZIDE 12.5 MG PO TABS: 12.5000 mg | ORAL_TABLET | Freq: Every day | ORAL | 1 refills | Status: DC

## 2023-02-02 MED ORDER — ATORVASTATIN CALCIUM 10 MG PO TABS: 10.0000 mg | ORAL_TABLET | Freq: Every day | ORAL | 1 refills | Status: DC

## 2023-02-02 NOTE — Telephone Encounter (Signed)
Called and left message for pt to call and schedule appt, after she cancelled, for this month OR she needs to let us know if she is changing providers so we can take Tamara Olson' name off

## 2023-02-02 NOTE — Progress Notes (Signed)
Date:  02/02/2023   Name:  Tamara Olson   DOB:  1966-08-03   MRN:  [id number]   Chief Complaint: Hyperlipidemia, Hypertension, Depression, Insomnia, and COPD  Hyperlipidemia This is a chronic problem. The current episode started more than 1 year ago. The problem is controlled. Recent lipid tests were reviewed and are normal. She has no history of chronic renal disease, diabetes, hypothyroidism or liver disease. There are no known factors aggravating her hyperlipidemia. Pertinent negatives include no chest pain, focal sensory loss, focal weakness, myalgias or shortness of breath. Current antihyperlipidemic treatment includes statins. The current treatment provides moderate improvement of lipids.  Hypertension This is a chronic problem. The current episode started more than 1 month ago. The problem has been gradually improving since onset. The problem is controlled. Pertinent negatives include no chest pain, headaches, palpitations or shortness of breath. Past treatments include calcium channel blockers and diuretics. The current treatment provides moderate improvement. There are no compliance problems.  There is no history of chronic renal disease.  Depression        This is a chronic problem.  The current episode started more than 1 year ago.   The problem has been gradually improving since onset.  Associated symptoms include insomnia.  Associated symptoms include no decreased concentration, no fatigue, no helplessness, no hopelessness, not irritable, no restlessness, no decreased interest, no appetite change, no body aches, no myalgias, no headaches, no indigestion, not sad and no suicidal ideas.  Past treatments include SSRIs - Selective serotonin reuptake inhibitors.  Compliance with treatment is good.   Pertinent negatives include no hypothyroidism. Insomnia Primary symptoms: no difficulty falling asleep.   The onset quality is gradual. Past treatments include medication. PMH includes:  hypertension, depression.   COPD There is no chest tightness, cough, difficulty breathing, frequent throat clearing, hemoptysis, hoarse voice, shortness of breath, sputum production or wheezing. The problem occurs intermittently. The problem has been unchanged. Pertinent negatives include no appetite change, chest pain, headaches, myalgias or trouble swallowing. Her past medical history is significant for COPD.    Lab Results  Component Value Date   NA 140 07/14/2022   K 3.4 (L) 07/14/2022   CO2 22 07/14/2022   GLUCOSE 100 (H) 07/14/2022   BUN 19 07/14/2022   CREATININE 1.30 (H) 07/14/2022   CALCIUM 9.8 07/14/2022   EGFR 48 (L) 07/14/2022   GFRNONAA 69 07/07/2020   Lab Results  Component Value Date   CHOL 181 07/14/2022   HDL 69 07/14/2022   LDLCALC 66 07/14/2022   TRIG 294 (H) 07/14/2022   CHOLHDL 3.1 06/27/2018   Lab Results  Component Value Date   TSH 4.090 07/07/2020   Lab Results  Component Value Date   HGBA1C 5.5 06/01/2015   Lab Results  Component Value Date   WBC 8.6 06/01/2015   HGB 14.3 06/01/2015   HCT 43.5 06/01/2015   MCV 96 06/01/2015   PLT 329 06/01/2015   Lab Results  Component Value Date   ALT 15 07/14/2022   AST 15 07/14/2022   ALKPHOS 76 07/14/2022   BILITOT 0.3 07/14/2022   Lab Results  Component Value Date   VD25OH 41.5 01/01/2016     Review of Systems  Constitutional:  Negative for appetite change and fatigue.  HENT:  Negative for hoarse voice and trouble swallowing.   Respiratory:  Negative for cough, hemoptysis, sputum production, shortness of breath and wheezing.   Cardiovascular:  Negative for chest pain, palpitations and leg swelling.  Gastrointestinal:  Negative for abdominal pain and blood in stool.  Endocrine: Negative for polydipsia and polyuria.  Genitourinary:  Negative for difficulty urinating.  Musculoskeletal:  Negative for myalgias.  Neurological:  Negative for focal weakness and headaches.  Hematological:  Does not  bruise/bleed easily.  Psychiatric/Behavioral:  Positive for depression. Negative for decreased concentration and suicidal ideas. The patient has insomnia.     Patient Active Problem List   Diagnosis Date Noted   Status post abdominal hysterectomy 02/25/2021   Encounter for screening colonoscopy 07/28/2015   Vitamin D deficiency 06/02/2015   Hypothyroidism 06/02/2015   Overweight 06/01/2015   Essential hypertension 06/01/2015   Smoker 06/01/2015   COPD, mild ([place]) 06/01/2015   Intermittent low back pain 06/01/2015    No Known Allergies  Past Surgical History:  Procedure Laterality Date   ABDOMINAL HYSTERECTOMY  2009   APPENDECTOMY  1990's   CESAREAN SECTION  1989   DENTAL SURGERY     pulled top teeth- has dentures   TONSILLECTOMY  2010    Social History   Tobacco Use   Smoking status: Every Day    Packs/day: 1.00    Years: 30.00    Additional pack years: 0.00    Total pack years: 30.00    Types: Cigarettes   Smokeless tobacco: Never   Tobacco comments:    Patient not ready to quit yet  Vaping Use   Vaping Use: Never used  Substance Use Topics   Alcohol use: Yes    Alcohol/week: 2.0 - 4.0 standard drinks of alcohol    Types: 2 - 4 Cans of beer per week    Comment: 2-4 q day   Drug use: No     Medication list has been reviewed and updated.  Current Meds  Medication Sig   albuterol (PROAIR HFA) 108 (90 Base) MCG/ACT inhaler Inhale 2 puffs into the lungs every 6 (six) hours as needed for wheezing or shortness of breath.   amLODipine (NORVASC) 2.5 MG tablet TAKE 1 TABLET BY MOUTH EVERY DAY       02/02/2023    1:19 PM 08/04/2022    1:46 PM 07/14/2022    8:50 AM 07/09/2021    8:41 AM  GAD 7 : Generalized Anxiety Score  Nervous, Anxious, on Edge 0 0 1 0  Control/stop worrying 0 0 0 0  Worry too much - different things 0 0 0 0  Trouble relaxing 0 0 0 0  Restless 0 0 0 0  Easily annoyed or irritable 0 0 0 0  Afraid - awful might happen 0 0 0 0  Total GAD 7  Score 0 0 1 0  Anxiety Difficulty Not difficult at all Not difficult at all Not difficult at all        02/02/2023    1:19 PM 08/04/2022    1:46 PM 07/14/2022    8:49 AM  Depression screen PHQ 2/9  Decreased Interest 0 0 0  Down, Depressed, Hopeless 0 0 0  PHQ - 2 Score 0 0 0  Altered sleeping 0 0 0  Tired, decreased energy 0 0 0  Change in appetite 0 0 0  Feeling bad or failure about yourself  0 0 0  Trouble concentrating 0 0 0  Moving slowly or fidgety/restless 0 0 1  Suicidal thoughts 0 0 0  PHQ-9 Score 0 0 1  Difficult doing work/chores Not difficult at all Not difficult at all Not difficult at all    BP Readings  from Last 3 Encounters:  02/02/23 120/70  08/04/22 102/62  07/14/22 (!) 142/98    Physical Exam Vitals and nursing note reviewed. Exam conducted with a chaperone present.  Constitutional:      General: She is not irritable.She is not in acute distress.    Appearance: She is not diaphoretic.  HENT:     Head: Normocephalic and atraumatic.     Right Ear: External ear normal.     Left Ear: External ear normal.     Nose: Nose normal.  Eyes:     General:        Right eye: No discharge.        Left eye: No discharge.     Conjunctiva/sclera: Conjunctivae normal.     Pupils: Pupils are equal, round, and reactive to light.  Neck:     Thyroid: No thyromegaly.     Vascular: No JVD.  Cardiovascular:     Rate and Rhythm: Normal rate and regular rhythm.     Heart sounds: Normal heart sounds. No murmur heard.    No friction rub. No gallop.  Pulmonary:     Effort: Pulmonary effort is normal.     Breath sounds: Normal breath sounds.  Abdominal:     General: Bowel sounds are normal.     Palpations: Abdomen is soft. There is no mass.     Tenderness: There is no abdominal tenderness. There is no guarding.  Musculoskeletal:        General: Normal range of motion.     Cervical back: Normal range of motion and neck supple.  Lymphadenopathy:     Cervical: No cervical  adenopathy.  Skin:    General: Skin is warm and dry.  Neurological:     Mental Status: She is alert.     Deep Tendon Reflexes: Reflexes are normal and symmetric.     Wt Readings from Last 3 Encounters:  02/02/23 164 lb (74.4 kg)  08/04/22 167 lb (75.8 kg)  07/14/22 159 lb (72.1 kg)    BP 120/70 (BP Location: Right Arm, Cuff Size: Large)   Pulse 84   Ht 5\' 1"  (1.549 m)   Wt 164 lb (74.4 kg)   SpO2 99%   BMI 30.99 kg/m   Assessment and Plan:  1. 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  Controlled.  Stable.  Continue albuterol 1 to 2 puffs every 6 hours as needed for wheezing or difficulty breathing. - albuterol (PROAIR HFA) 108 (90 Base) MCG/ACT inhaler; Inhale 2 puffs into the lungs every 6 (six) hours as needed for wheezing or shortness of breath.  Dispense: 6.7 g; Refill: 11  2. Essential hypertension Chronic.  Controlled.  Stable.  Blood pressure 120/70.  Asymptomatic.  Tolerating medications well.  Continue amlodipine 2.5 mg once a day and hydrochlorothiazide 12.5 mg once a day.  Will check renal function panel for status of electrolytes and GFR.  Will recheck patient in 6 months - amLODipine (NORVASC) 2.5 MG tablet; Take 1 tablet (2.5 mg total) by mouth daily.  Dispense: 90 tablet; Refill: 1 - hydrochlorothiazide (HYDRODIURIL) 12.5 MG tablet; Take 1 tablet (12.5 mg total) by mouth daily.  Dispense: 90 tablet; Refill: 1 - Renal Function Panel  3. Mixed hyperlipidemia Chronic.  Controlled.  Stable.  Continue atorvastatin 10 mg once a day.  Will check lipids with lipid panel. - atorvastatin (LIPITOR) 10 MG tablet; Take 1 tablet (10 mg total) by mouth daily.  Dispense: 90 tablet; Refill: 1 - Lipid Panel With LDL/HDL Ratio  4. Generalized anxiety disorder with panic attacks Chronic.  Controlled.  Stable.  PHQ 0 GAD score is 0 continue sertraline 25 mg 1 a day.  Patient may take hydroxyzine 10 mg for panic attacks. - hydrOXYzine (ATARAX) 10 MG tablet; Take 1 tablet (10 mg total) by mouth as  needed. Prn panic  Dispense: 90 tablet; Refill: 1 - sertraline (ZOLOFT) 25 MG tablet; Take 1 tablet (25 mg total) by mouth daily.  Dispense: 90 tablet; Refill: 1    Otilio Miu, MD

## 2023-02-03 LAB — LIPID PANEL WITH LDL/HDL RATIO
Cholesterol, Total: 170 mg/dL (ref 100–199)
HDL: 70 mg/dL (ref >39)
LDL Chol Calc (NIH): 57 mg/dL (ref 0–99)
LDL/HDL Ratio: 0.8 ratio (ref 0.0–3.2)
Triglycerides: 279 mg/dL — ABNORMAL HIGH (ref 0–149)
VLDL Cholesterol Cal: 43 mg/dL — ABNORMAL HIGH (ref 5–40)

## 2023-02-03 LAB — RENAL FUNCTION PANEL
Albumin: 4 g/dL (ref 3.8–4.9)
BUN/Creatinine Ratio: 12 (ref 9–23)
BUN: 14 mg/dL (ref 6–24)
CO2: 22 mmol/L (ref 20–29)
Calcium: 9.2 mg/dL (ref 8.7–10.2)
Chloride: 100 mmol/L (ref 96–106)
Creatinine, Ser: 1.14 mg/dL — ABNORMAL HIGH (ref 0.57–1.00)
Glucose: 89 mg/dL (ref 70–99)
Phosphorus: 2.9 mg/dL — ABNORMAL LOW (ref 3.0–4.3)
Potassium: 3.1 mmol/L — ABNORMAL LOW (ref 3.5–5.2)
Sodium: 140 mmol/L (ref 134–144)
eGFR: 56 mL/min/{1.73_m2} — ABNORMAL LOW (ref >59)

## 2023-02-06 ENCOUNTER — Other Ambulatory Visit: Payer: Self-pay

## 2023-02-06 DIAGNOSIS — E876 Hypokalemia: Secondary | ICD-10-CM

## 2023-02-06 MED ORDER — POTASSIUM CHLORIDE CRYS ER 10 MEQ PO TBCR: 10.0000 meq | EXTENDED_RELEASE_TABLET | Freq: Two times a day (BID) | ORAL | 0 refills | Status: DC

## 2023-03-02 ENCOUNTER — Other Ambulatory Visit: Payer: Self-pay | Admitting: Family Medicine

## 2023-03-02 DIAGNOSIS — E876 Hypokalemia: Secondary | ICD-10-CM

## 2023-03-03 NOTE — Telephone Encounter (Signed)
Unable to refill per protocol, Rx request is too soon. Last refill 02/06/23 for 45 days.  Requested Prescriptions  Pending Prescriptions Disp Refills   KLOR-CON M10 10 MEQ tablet [Pharmacy Med Name: KLOR-CON M10 TABLET] 180 tablet 1    Sig: TAKE 1 TABLET BY MOUTH 2 TIMES DAILY.     Endocrinology:  Minerals - Potassium Supplementation Failed - 03/02/2023  1:30 PM      Failed - K in normal range and within 360 days    Potassium  Date Value Ref Range Status  02/02/2023 3.1 (L) 3.5 - 5.2 mmol/L Final         Failed - Cr in normal range and within 360 days    Creatinine, Ser  Date Value Ref Range Status  02/02/2023 1.14 (H) 0.57 - 1.00 mg/dL Final         Passed - Valid encounter within last 12 months    Recent Outpatient Visits           4 weeks ago COPD, mild (HCC)   [place] [facility] MD   7 months ago Generalized anxiety disorder with panic attacks   [place] [facility], MD   7 months ago Essential hypertension   [place] [facility], MD   1 year ago Essential hypertension   [place] [facility], MD   1 year ago Annual physical exam   [facility] at [facility], MD       Future Appointments             In 5 months Duanne Limerick, MD [facility] at [facility], [facility]   In 5 months Duanne Limerick, MD [facility] at [facility], [facility]

## 2023-03-07 ENCOUNTER — Other Ambulatory Visit: Payer: Self-pay | Admitting: Family Medicine

## 2023-03-07 DIAGNOSIS — E876 Hypokalemia: Secondary | ICD-10-CM

## 2023-03-07 NOTE — Telephone Encounter (Signed)
Requested medication (s) are due for refill today - please review  Requested medication (s) are on the active medication list -yes  Future visit scheduled -yes  Last refill: 02/06/23 #90  Notes to clinic: Please review- lab results direct 10 mg once daily- Rx states twice daily  Requested Prescriptions  Pending Prescriptions Disp Refills   KLOR-CON M10 10 MEQ tablet [Pharmacy Med Name: KLOR-CON M10 TABLET] 60 tablet 1    Sig: TAKE 1 TABLET BY MOUTH 2 TIMES DAILY.     Endocrinology:  Minerals - Potassium Supplementation Failed - 03/07/2023  1:46 AM      Failed - K in normal range and within 360 days    Potassium  Date Value Ref Range Status  02/02/2023 3.1 (L) 3.5 - 5.2 mmol/L Final         Failed - Cr in normal range and within 360 days    Creatinine, Ser  Date Value Ref Range Status  02/02/2023 1.14 (H) 0.57 - 1.00 mg/dL Final         Passed - Valid encounter within last 12 months    Recent Outpatient Visits           1 month ago COPD, mild (HCC)   [place] [facility]   7 months ago Generalized anxiety disorder with panic attacks   [place] [facility]   7 months ago Essential hypertension   [place] [facility]   1 year ago Essential hypertension   [place] [facility]   1 year ago Annual physical exam   [facility]       Future Appointments             In 4 months Duanne Limerick, MD [facility] at [facility], [facility]   In 5 months Duanne Limerick, MD [facility] at [facility], [facility]               Requested Prescriptions  Pending Prescriptions Disp Refills   KLOR-CON M10 10  MEQ tablet [Pharmacy Med Name: KLOR-CON M10 TABLET] 60 tablet 1    Sig: TAKE 1 TABLET BY MOUTH 2 TIMES DAILY.     Endocrinology:  Minerals - Potassium Supplementation Failed - 03/07/2023  1:46 AM      Failed - K in normal range and within 360 days    Potassium  Date Value Ref Range Status  02/02/2023 3.1 (L) 3.5 - 5.2 mmol/L Final         Failed - Cr in normal range and within 360 days    Creatinine, Ser  Date Value Ref Range Status  02/02/2023 1.14 (H) 0.57 - 1.00 mg/dL Final         Passed - Valid encounter within last 12 months    Recent Outpatient Visits           1 month ago COPD, mild (HCC)   [place] [facility]   7 months ago Generalized anxiety disorder with panic attacks   [facility] at [facility] Duanne Limerick,  MD   7 months ago Essential hypertension   [place] [facility]   1 year ago Essential hypertension   [place] [facility]   1 year ago Annual physical exam   [facility] at [facility]       Future Appointments             In 4 months Duanne Limerick, MD [facility] Primary [facility] Medicine at [facility], [facility]   In 5 months Duanne Limerick, MD [facility] Primary [facility] & Sports Medicine at [facility], [facility]

## 2023-04-30 ENCOUNTER — Other Ambulatory Visit: Payer: Self-pay | Admitting: Family Medicine

## 2023-04-30 DIAGNOSIS — E876 Hypokalemia: Secondary | ICD-10-CM

## 2023-05-01 NOTE — Telephone Encounter (Signed)
Requested Prescriptions  Pending Prescriptions Disp Refills   KLOR-CON M10 10 MEQ tablet [Pharmacy Med Name: KLOR-CON M10 TABLET] 180 tablet 0    Sig: TAKE 1 TABLET BY MOUTH 2 TIMES DAILY.     Endocrinology:  Minerals - Potassium Supplementation Failed - 04/30/2023  7:59 PM      Failed - K in normal range and within 360 days    Potassium  Date Value Ref Range Status  02/02/2023 3.1 (L) 3.5 - 5.2 mmol/L Final         Failed - Cr in normal range and within 360 days    Creatinine, Ser  Date Value Ref Range Status  02/02/2023 1.14 (H) 0.57 - 1.00 mg/dL Final         Passed - Valid encounter within last 12 months    Recent Outpatient Visits           2 months ago COPD, mild (HCC)   [place] [facility]   9 months ago Generalized anxiety disorder with panic attacks   [place] [facility]   9 months ago Essential hypertension   [place] [facility]   1 year ago Essential hypertension    [facility]   1 year ago Annual physical exam   [facility] at [facility]       Future Appointments             In 3 months Duanne Limerick, MD [facility] at [facility], [facility]   In 3 months Duanne Limerick, MD [facility] Primary Care & Sports Medicine at [facility], [facility]

## 2023-06-24 ENCOUNTER — Other Ambulatory Visit: Payer: Self-pay | Admitting: Family Medicine

## 2023-06-24 DIAGNOSIS — F410 Panic disorder [episodic paroxysmal anxiety] without agoraphobia: Secondary | ICD-10-CM

## 2023-06-26 NOTE — Telephone Encounter (Signed)
Requested Prescriptions  Pending Prescriptions Disp Refills   sertraline (ZOLOFT) 25 MG tablet [Pharmacy Med Name: SERTRALINE HCL 25 MG TABLET] 90 tablet 0    Sig: TAKE 1 TABLET (25 MG TOTAL) BY MOUTH DAILY.     Psychiatry:  Antidepressants - SSRI - sertraline Passed - 06/24/2023  8:46 AM      Passed - AST in normal range and within 360 days    AST  Date Value Ref Range Status  07/14/2022 15 0 - 40 IU/L Final         Passed - ALT in normal range and within 360 days    ALT  Date Value Ref Range Status  07/14/2022 15 0 - 32 IU/L Final         Passed - Completed PHQ-2 or PHQ-9 in the last 360 days      Passed - Valid encounter within last 6 months    Recent Outpatient Visits           4 months ago COPD, mild (HCC)   [place] [facility]   10 months ago Generalized anxiety disorder with panic attacks   [place] [facility]   11 months ago Essential hypertension   [place] [facility]   1 year ago Essential hypertension    [facility]   1 year ago Annual physical exam   [facility] at [facility]       Future Appointments             In 1 month Duanne Limerick, MD [facility] Primary Care & Sports Medicine at [facility], [facility]   In 1 month Duanne Limerick, MD [facility] Primary Care & Sports Medicine at [facility], [facility]

## 2023-07-19 ENCOUNTER — Encounter: Payer: Commercial Managed Care - PPO | Admitting: Family Medicine

## 2023-07-27 ENCOUNTER — Other Ambulatory Visit: Payer: Self-pay | Admitting: Family Medicine

## 2023-07-27 DIAGNOSIS — E876 Hypokalemia: Secondary | ICD-10-CM

## 2023-07-27 NOTE — Telephone Encounter (Signed)
Requested Prescriptions  Pending Prescriptions Disp Refills   KLOR-CON M10 10 MEQ tablet [Pharmacy Med Name: KLOR-CON M10 TABLET] 180 tablet 0    Sig: TAKE 1 TABLET BY MOUTH TWICE A DAY     Endocrinology:  Minerals - Potassium Supplementation Failed - 07/27/2023  1:32 AM      Failed - K in normal range and within 360 days    Potassium  Date Value Ref Range Status  02/02/2023 3.1 (L) 3.5 - 5.2 mmol/L Final         Failed - Cr in normal range and within 360 days    Creatinine, Ser  Date Value Ref Range Status  02/02/2023 1.14 (H) 0.57 - 1.00 mg/dL Final         Passed - Valid encounter within last 12 months    Recent Outpatient Visits           5 months ago COPD, mild (HCC)   [place] [facility]   11 months ago Generalized anxiety disorder with panic attacks   [place] [facility]   1 year ago Essential hypertension   [place] [facility]   1 year ago Essential hypertension   [place] [facility]   2 years ago Annual physical exam   [facility] at [facility]       Future Appointments             In 5 days Duanne Limerick, MD [facility] Primary Care & Sports Medicine at [facility], [facility]   In 1 week Duanne Limerick, MD [facility] Health Primary Care & Sports Medicine at [facility], [facility]

## 2023-08-01 ENCOUNTER — Encounter: Payer: Self-pay | Admitting: Family Medicine

## 2023-08-01 ENCOUNTER — Ambulatory Visit: Payer: Commercial Managed Care - PPO | Admitting: Family Medicine

## 2023-08-01 VITALS — BP 118/78 | HR 98 | Ht 61.0 in | Wt 161.0 lb

## 2023-08-01 DIAGNOSIS — J449 Chronic obstructive pulmonary disease, unspecified: Secondary | ICD-10-CM | POA: Diagnosis not present

## 2023-08-01 DIAGNOSIS — E876 Hypokalemia: Secondary | ICD-10-CM

## 2023-08-01 DIAGNOSIS — E782 Mixed hyperlipidemia: Secondary | ICD-10-CM | POA: Diagnosis not present

## 2023-08-01 DIAGNOSIS — I10 Essential (primary) hypertension: Secondary | ICD-10-CM

## 2023-08-01 DIAGNOSIS — F411 Generalized anxiety disorder: Secondary | ICD-10-CM | POA: Diagnosis not present

## 2023-08-01 DIAGNOSIS — Z23 Encounter for immunization: Secondary | ICD-10-CM

## 2023-08-01 DIAGNOSIS — F410 Panic disorder [episodic paroxysmal anxiety] without agoraphobia: Secondary | ICD-10-CM

## 2023-08-01 MED ORDER — POTASSIUM CHLORIDE CRYS ER 10 MEQ PO TBCR: 10.0000 meq | EXTENDED_RELEASE_TABLET | Freq: Two times a day (BID) | ORAL | 0 refills | Status: DC

## 2023-08-01 MED ORDER — HYDROXYZINE HCL 10 MG PO TABS
10.0000 mg | ORAL_TABLET | ORAL | 1 refills | Status: DC | PRN
Start: 2023-08-01 — End: 2024-01-16

## 2023-08-01 MED ORDER — SERTRALINE HCL 25 MG PO TABS
25.0000 mg | ORAL_TABLET | Freq: Every day | ORAL | 1 refills | Status: DC
Start: 2023-08-01 — End: 2023-09-24

## 2023-08-01 MED ORDER — AMLODIPINE BESYLATE 2.5 MG PO TABS
2.5000 mg | ORAL_TABLET | Freq: Every day | ORAL | 1 refills | Status: DC
Start: 2023-08-01 — End: 2024-01-02

## 2023-08-01 MED ORDER — HYDROCHLOROTHIAZIDE 12.5 MG PO TABS
12.5000 mg | ORAL_TABLET | Freq: Every day | ORAL | 1 refills | Status: DC
Start: 2023-08-01 — End: 2024-08-07

## 2023-08-01 MED ORDER — SERTRALINE HCL 25 MG PO TABS
25.0000 mg | ORAL_TABLET | Freq: Every day | ORAL | 0 refills | Status: DC
Start: 2023-08-01 — End: 2023-08-01

## 2023-08-01 MED ORDER — ALBUTEROL SULFATE HFA 108 (90 BASE) MCG/ACT IN AERS
2.0000 | INHALATION_SPRAY | Freq: Four times a day (QID) | RESPIRATORY_TRACT | 11 refills | Status: DC | PRN
Start: 2023-08-01 — End: 2024-08-14

## 2023-08-01 MED ORDER — ATORVASTATIN CALCIUM 10 MG PO TABS
10.0000 mg | ORAL_TABLET | Freq: Every day | ORAL | 1 refills | Status: DC
Start: 2023-08-01 — End: 2023-10-12

## 2023-08-01 NOTE — Progress Notes (Signed)
Date:  08/01/2023   Name:  Tamara Olson   DOB:  Oct 29, 1966   MRN:  [id number]   Chief Complaint: Depression, COPD, Hypertension, Hyperlipidemia, and hypokalemia  Depression        This is a chronic problem.  The current episode started more than 1 year ago.   The problem has been gradually improving since onset.  Associated symptoms include no decreased concentration, no fatigue, no helplessness, no hopelessness, does not have insomnia, not irritable, no restlessness, no decreased interest, no appetite change, no body aches, no myalgias, no headaches, no indigestion, not sad and no suicidal ideas.     The symptoms are aggravated by nothing.  Past treatments include SSRIs - Selective serotonin reuptake inhibitors.  Previous treatment provided moderate relief.   Pertinent negatives include no anxiety. COPD There is no chest tightness, difficulty breathing, frequent throat clearing, shortness of breath, sputum production or wheezing. The problem occurs intermittently. The problem has been gradually improving. Pertinent negatives include no appetite change, chest pain, headaches, malaise/fatigue, myalgias, PND or sweats. Her symptoms are not alleviated by beta-agonist. Her past medical history is significant for COPD.  Hypertension This is a chronic problem. The current episode started more than 1 year ago. The problem has been gradually improving since onset. The problem is controlled. Pertinent negatives include no anxiety, blurred vision, chest pain, headaches, malaise/fatigue, neck pain, orthopnea, palpitations, peripheral edema, PND, shortness of breath or sweats. There are no associated agents to hypertension. There are no known risk factors for coronary artery disease. Past treatments include calcium channel blockers and diuretics. The current treatment provides moderate improvement. There are no compliance problems.  There is no history of CAD/MI or CVA. There is no history of chronic renal  disease, a hypertension causing med or renovascular disease.  Hyperlipidemia This is a chronic problem. The current episode started more than 1 year ago. The problem is controlled. Recent lipid tests were reviewed and are normal. She has no history of chronic renal disease. Pertinent negatives include no chest pain, myalgias or shortness of breath. Current antihyperlipidemic treatment includes statins. The current treatment provides moderate improvement of lipids. There are no compliance problems.     Lab Results  Component Value Date   NA 140 02/02/2023   K 3.1 (L) 02/02/2023   CO2 22 02/02/2023   GLUCOSE 89 02/02/2023   BUN 14 02/02/2023   CREATININE 1.14 (H) 02/02/2023   CALCIUM 9.2 02/02/2023   EGFR 56 (L) 02/02/2023   GFRNONAA 69 07/07/2020   Lab Results  Component Value Date   CHOL 170 02/02/2023   HDL 70 02/02/2023   LDLCALC 57 02/02/2023   TRIG 279 (H) 02/02/2023   CHOLHDL 3.1 06/27/2018   Lab Results  Component Value Date   TSH 4.090 07/07/2020   Lab Results  Component Value Date   HGBA1C 5.5 06/01/2015   Lab Results  Component Value Date   WBC 8.6 06/01/2015   HGB 14.3 06/01/2015   HCT 43.5 06/01/2015   MCV 96 06/01/2015   PLT 329 06/01/2015   Lab Results  Component Value Date   ALT 15 07/14/2022   AST 15 07/14/2022   ALKPHOS 76 07/14/2022   BILITOT 0.3 07/14/2022   Lab Results  Component Value Date   VD25OH 41.5 01/01/2016     Review of Systems  Constitutional:  Negative for appetite change, fatigue and malaise/fatigue.  Eyes:  Negative for blurred vision and visual disturbance.  Respiratory:  Negative for sputum  production, chest tightness, shortness of breath and wheezing.   Cardiovascular:  Negative for chest pain, palpitations, orthopnea and PND.  Gastrointestinal:  Negative for abdominal pain, blood in stool, nausea and rectal pain.  Genitourinary:  Negative for difficulty urinating, hematuria and vaginal bleeding.  Musculoskeletal:   Negative for myalgias and neck pain.  Neurological:  Negative for headaches.  Psychiatric/Behavioral:  Positive for depression. Negative for decreased concentration and suicidal ideas. The patient does not have insomnia.     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   Intermittent low back pain 06/01/2015    No Known Allergies  Past Surgical History:  Procedure Laterality Date   ABDOMINAL HYSTERECTOMY  2009   APPENDECTOMY  1990's   CESAREAN SECTION  1989   DENTAL SURGERY     pulled top teeth- has dentures   TONSILLECTOMY  2010    Social History   Tobacco Use   Smoking status: Every Day    Current packs/day: 1.00    Average packs/day: 1 pack/day for 30.0 years (30.0 ttl pk-yrs)    Types: Cigarettes   Smokeless tobacco: Never   Tobacco comments:    Patient not ready to quit yet  Vaping Use   Vaping status: Never Used  Substance Use Topics   Alcohol use: Yes    Alcohol/week: 2.0 - 4.0 standard drinks of alcohol    Types: 2 - 4 Cans of beer per week    Comment: 2-4 q day   Drug use: No     Medication list has been reviewed and updated.  No outpatient medications have been marked as taking for the 08/01/23 encounter (Office Visit) with Duanne Limerick, MD.       08/01/2023    1:08 PM 02/02/2023    1:19 PM 08/04/2022    1:46 PM 07/14/2022    8:50 AM  GAD 7 : Generalized Anxiety Score  Nervous, Anxious, on Edge 0 0 0 1  Control/stop worrying 0 0 0 0  Worry too much - different things 0 0 0 0  Trouble relaxing 0 0 0 0  Restless 0 0 0 0  Easily annoyed or irritable 0 0 0 0  Afraid - awful might happen 0 0 0 0  Total GAD 7 Score 0 0 0 1  Anxiety Difficulty Not difficult at all Not  difficult at all Not difficult at all Not difficult at all       08/01/2023    1:08 PM 02/02/2023    1:19 PM 08/04/2022    1:46 PM  Depression screen PHQ 2/9  Decreased Interest 0 0 0  Down, Depressed, Hopeless 0 0 0  PHQ - 2 Score 0 0 0  Altered sleeping 0 0 0  Tired, decreased energy 0 0 0  Change in appetite 0 0 0  Feeling bad or failure about yourself  0 0 0  Trouble concentrating 0 0 0  Moving slowly or fidgety/restless 0 0 0  Suicidal thoughts 0 0 0  PHQ-9 Score 0 0 0  Difficult doing work/chores Not difficult at all Not difficult at all Not difficult at all    BP Readings from Last 3 Encounters:  08/01/23 118/78  02/02/23 120/70  08/04/22 102/62  Physical Exam Vitals and nursing note reviewed. Exam conducted with a chaperone present.  Constitutional:      General: She is not irritable.She is not in acute distress.    Appearance: She is not diaphoretic.  HENT:     Head: Normocephalic and atraumatic.     Right Ear: Tympanic membrane and external ear normal. There is no impacted cerumen.     Left Ear: Tympanic membrane and external ear normal. There is no impacted cerumen.     Nose: Nose normal. No congestion or rhinorrhea.     Mouth/Throat:     Mouth: Mucous membranes are moist.  Eyes:     General:        Right eye: No discharge.        Left eye: No discharge.     Conjunctiva/sclera: Conjunctivae normal.     Pupils: Pupils are equal, round, and reactive to light.  Neck:     Thyroid: No thyromegaly.     Vascular: No JVD.  Cardiovascular:     Rate and Rhythm: Normal rate and regular rhythm.     Heart sounds: Normal heart sounds. No murmur heard.    No friction rub. No gallop.  Pulmonary:     Effort: Pulmonary effort is normal.     Breath sounds: Normal breath sounds. No wheezing, rhonchi or rales.  Abdominal:     General: Bowel sounds are normal.     Palpations: Abdomen is soft. There is no mass.     Tenderness: There is no abdominal tenderness. There is  no left CVA tenderness, guarding or rebound.  Musculoskeletal:        General: Normal range of motion.     Cervical back: Normal range of motion and neck supple.  Lymphadenopathy:     Cervical: No cervical adenopathy.  Skin:    General: Skin is warm and dry.  Neurological:     Mental Status: She is alert.     Wt Readings from Last 3 Encounters:  08/01/23 161 lb (73 kg)  02/02/23 164 lb (74.4 kg)  08/04/22 167 lb (75.8 kg)    BP 118/78   Pulse 98   Ht 5\' 1"  (1.549 m)   Wt 161 lb (73 kg)   SpO2 98%   BMI 30.42 kg/m   Assessment and Plan: 1. 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  Controlled.  Stable.  Continue albuterol inhaler 2 puffs every 6 hours as needed when shortness of breath or wheezing occurs. - albuterol (PROAIR HFA) 108 (90 Base) MCG/ACT inhaler; Inhale 2 puffs into the lungs every 6 (six) hours as needed for wheezing or shortness of breath.  Dispense: 6.7 g; Refill: 11  2. Essential hypertension Chronic.  Controlled.  Stable.  Blood pressure today is blood pressure is 118/78.  Asymptomatic.  Tolerating medication well.  Continue amlodipine 2.5 mg once a day and hydrochlorothiazide 12.5 mg once a day.  Will check CMP for electrolytes and GFR. - amLODipine (NORVASC) 2.5 MG tablet; Take 1 tablet (2.5 mg total) by mouth daily.  Dispense: 90 tablet; Refill: 1 - hydrochlorothiazide (HYDRODIURIL) 12.5 MG tablet; Take 1 tablet (12.5 mg total) by mouth daily.  Dispense: 90 tablet; Refill: 1 - Comprehensive Metabolic Panel (CMET)  3. Mixed hyperlipidemia Chronic.  Controlled.  Stable.  Continue atorvastatin 10 mg once a day. - atorvastatin (LIPITOR) 10 MG tablet; Take 1 tablet (10 mg total) by mouth daily.  Dispense: 90 tablet; Refill: 1  4. Generalized anxiety disorder with panic attacks Chronic.  Controlled.  Stable.  PHQ 0 GAD score 0 continue sertraline 25 mg daily. - hydrOXYzine (ATARAX) 10 MG tablet; Take 1 tablet (10 mg total) by mouth as needed. Prn panic  Dispense: 90 tablet;  Refill: 1 - sertraline (ZOLOFT) 25 MG tablet; Take 1 tablet (25 mg total) by mouth daily.  Dispense: 90 tablet; Refill: 0  5. Hypokalemia Compensated with - potassium chloride (KLOR-CON M10) 10 MEQ tablet; Take 1 tablet (10 mEq total) by mouth 2 (two) times daily.  Dispense: 180 tablet; Refill: 0 - Comprehensive Metabolic Panel (CMET)  6. Need for influenza vaccination Discussed and administered - Flu vaccine trivalent PF, 6mos and older(Flulaval,Afluria,Fluarix,Fluzone)     Elizabeth Sauer, MD

## 2023-08-08 ENCOUNTER — Ambulatory Visit (INDEPENDENT_AMBULATORY_CARE_PROVIDER_SITE_OTHER): Payer: Commercial Managed Care - PPO | Admitting: Family Medicine

## 2023-08-08 ENCOUNTER — Encounter: Payer: Self-pay | Admitting: Family Medicine

## 2023-08-08 VITALS — BP 120/82 | HR 100 | Ht 61.0 in | Wt 154.0 lb

## 2023-08-08 DIAGNOSIS — Z0000 Encounter for general adult medical examination without abnormal findings: Secondary | ICD-10-CM

## 2023-08-08 NOTE — Progress Notes (Signed)
Date:  08/08/2023   Name:  Tamara Olson   DOB:  1965/11/19   MRN:  [id number]   Chief Complaint: Annual Exam (Breast exam)  Patient is a 57 year old female who presents for a comprehensive physical exam. The patient reports the following problems: none. Health maintenance has been reviewed up to date as allowed.      Lab Results  Component Value Date   NA 138 08/01/2023   K 3.7 08/01/2023   CO2 18 (L) 08/01/2023   GLUCOSE 116 (H) 08/01/2023   BUN 23 08/01/2023   CREATININE 1.66 (H) 08/01/2023   CALCIUM 9.5 08/01/2023   EGFR 36 (L) 08/01/2023   GFRNONAA 69 07/07/2020   Lab Results  Component Value Date   CHOL 170 02/02/2023   HDL 70 02/02/2023   LDLCALC 57 02/02/2023   TRIG 279 (H) 02/02/2023   CHOLHDL 3.1 06/27/2018   Lab Results  Component Value Date   TSH 4.090 07/07/2020   Lab Results  Component Value Date   HGBA1C 5.5 06/01/2015   Lab Results  Component Value Date   WBC 8.6 06/01/2015   HGB 14.3 06/01/2015   HCT 43.5 06/01/2015   MCV 96 06/01/2015   PLT 329 06/01/2015   Lab Results  Component Value Date   ALT 12 08/01/2023   AST 24 08/01/2023   ALKPHOS 94 08/01/2023   BILITOT 0.3 08/01/2023   Lab Results  Component Value Date   VD25OH 41.5 01/01/2016     Review of Systems  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   Intermittent low back pain 06/01/2015    No Known Allergies  Past Surgical History:  Procedure Laterality Date   ABDOMINAL HYSTERECTOMY  2009   APPENDECTOMY  1990's   CESAREAN SECTION  1989   DENTAL SURGERY     pulled top teeth- has dentures   TONSILLECTOMY  2010    Social History   Tobacco Use   Smoking status: Every Day    Current  packs/day: 1.00    Average packs/day: 1 pack/day for 30.0 years (30.0 ttl pk-yrs)    Types: Cigarettes   Smokeless tobacco: Never   Tobacco comments:    Patient not ready to quit yet  Vaping Use   Vaping status: Never Used  Substance Use Topics   Alcohol use: Yes    Alcohol/week: 2.0 - 4.0 standard drinks of alcohol    Types: 2 - 4 Cans of beer per week    Comment: 2-4 q day   Drug use: No     Medication list has been reviewed and updated.  Current Meds  Medication Sig   albuterol (PROAIR HFA) 108 (90 Base) MCG/ACT inhaler Inhale 2 puffs into the lungs every 6 (six) hours as needed for wheezing or shortness of breath.   amLODipine (NORVASC) 2.5 MG tablet Take 1 tablet (2.5 mg total) by mouth daily.   atorvastatin (LIPITOR) 10 MG tablet Take 1 tablet (10 mg total) by mouth daily.   fexofenadine (ALLEGRA) 180 MG tablet Take 180 mg by mouth daily. otc   hydrochlorothiazide (HYDRODIURIL) 12.5 MG tablet Take 1 tablet (12.5 mg total) by mouth daily.   hydrOXYzine (ATARAX) 10 MG tablet Take 1 tablet (  10 mg total) by mouth as needed. Prn panic   potassium chloride (KLOR-CON M10) 10 MEQ tablet Take 1 tablet (10 mEq total) by mouth 2 (two) times daily.   sertraline (ZOLOFT) 25 MG tablet Take 1 tablet (25 mg total) by mouth daily.       08/08/2023    8:45 AM 08/01/2023    1:08 PM 02/02/2023    1:19 PM 08/04/2022    1:46 PM  GAD 7 : Generalized Anxiety Score  Nervous, Anxious, on Edge 0 0 0 0  Control/stop worrying 0 0 0 0  Worry too much - different things 0 0 0 0  Trouble relaxing 0 0 0 0  Restless 0 0 0 0  Easily annoyed or irritable 0 0 0 0  Afraid - awful might happen 0 0 0 0  Total GAD 7 Score 0 0 0 0  Anxiety Difficulty Not difficult at all Not difficult at all Not difficult at all Not difficult at all       08/08/2023    8:44 AM 08/01/2023    1:08 PM 02/02/2023    1:19 PM  Depression screen PHQ 2/9  Decreased Interest 0 0 0  Down, Depressed, Hopeless 0 0 0  PHQ - 2 Score  0 0 0  Altered sleeping 0 0 0  Tired, decreased energy 0 0 0  Change in appetite 0 0 0  Feeling bad or failure about yourself  0 0 0  Trouble concentrating 0 0 0  Moving slowly or fidgety/restless 0 0 0  Suicidal thoughts 0 0 0  PHQ-9 Score 0 0 0  Difficult doing work/chores Not difficult at all Not difficult at all Not difficult at all    BP Readings from Last 3 Encounters:  08/08/23 120/82  08/01/23 118/78  02/02/23 120/70    Physical Exam Vitals and nursing note reviewed. Exam conducted with a chaperone present.  Constitutional:      General: She is not in acute distress.    Appearance: She is well-groomed and overweight. She is not diaphoretic.  HENT:     Head: Normocephalic and atraumatic.     Jaw: There is normal jaw occlusion.     Right Ear: Hearing, tympanic membrane, ear canal and external ear normal.     Left Ear: Hearing, tympanic membrane, ear canal and external ear normal.     Nose: Nose normal.     Mouth/Throat:     Lips: Pink.     Mouth: Mucous membranes are moist.     Dentition: Normal dentition.     Tongue: No lesions.     Palate: No mass.     Pharynx: Oropharynx is clear. Uvula midline.     Tonsils: No tonsillar exudate or tonsillar abscesses.  Eyes:     General: Lids are normal. Vision grossly intact. Gaze aligned appropriately.        Right eye: No discharge.        Left eye: No discharge.     Conjunctiva/sclera: Conjunctivae normal.     Pupils: Pupils are equal, round, and reactive to light.     Funduscopic exam:    Right eye: Red reflex present.        Left eye: Red reflex present. Neck:     Thyroid: No thyroid mass, thyromegaly or thyroid tenderness.     Vascular: No JVD.  Cardiovascular:     Rate and Rhythm: Normal rate and regular rhythm.     Chest Wall: PMI is  not displaced.     Pulses: Normal pulses.          Carotid pulses are 2+ on the right side and 2+ on the left side.      Radial pulses are 2+ on the right side and 2+ on the left  side.       Femoral pulses are 2+ on the right side and 2+ on the left side.      Dorsalis pedis pulses are 2+ on the right side and 2+ on the left side.       Posterior tibial pulses are 2+ on the right side and 2+ on the left side.     Heart sounds: Normal heart sounds, S1 normal and S2 normal. No murmur heard.    No systolic murmur is present.     No diastolic murmur is present.     No friction rub. No gallop. No S3 or S4 sounds.  Pulmonary:     Effort: Pulmonary effort is normal.     Breath sounds: Normal breath sounds. No decreased breath sounds, wheezing, rhonchi or rales.  Chest:  Breasts:    Breasts are asymmetrical.     Right: Normal. No swelling, bleeding, inverted nipple, mass, nipple discharge, skin change or tenderness.     Left: Normal. No swelling, bleeding, inverted nipple, mass, nipple discharge, skin change or tenderness.     Comments: Smaller left Abdominal:     General: Bowel sounds are normal.     Palpations: Abdomen is soft. There is no hepatomegaly, splenomegaly or mass.     Tenderness: There is no abdominal tenderness. There is no guarding.     Hernia: There is no hernia in the umbilical area or ventral area.  Genitourinary:    Rectum: Normal. Guaiac result negative. No mass.  Musculoskeletal:        General: Normal range of motion.     Cervical back: Full passive range of motion without pain, normal range of motion and neck supple.     Right lower leg: No edema.     Left lower leg: No edema.  Lymphadenopathy:     Head:     Right side of head: No submental, submandibular or tonsillar adenopathy.     Left side of head: No submental, submandibular or tonsillar adenopathy.     Cervical: No cervical adenopathy.     Right cervical: No superficial, deep or posterior cervical adenopathy.    Left cervical: No superficial, deep or posterior cervical adenopathy.     Upper Body:     Right upper body: No supraclavicular or axillary adenopathy.     Left upper body: No  supraclavicular adenopathy.  Skin:    General: Skin is warm and dry.     Capillary Refill: Capillary refill takes less than 2 seconds.  Neurological:     General: No focal deficit present.     Mental Status: She is alert.     Cranial Nerves: Cranial nerves 2-12 are intact.     Sensory: Sensation is intact.     Motor: Motor function is intact.     Coordination: Romberg sign negative.     Deep Tendon Reflexes: Reflexes are normal and symmetric.  Psychiatric:        Behavior: Behavior is cooperative.     Wt Readings from Last 3 Encounters:  08/08/23 154 lb (69.9 kg)  08/01/23 161 lb (73 kg)  02/02/23 164 lb (74.4 kg)    BP 120/82   Pulse 100  Ht 5\' 1"  (1.549 m)   Wt 154 lb (69.9 kg)   SpO2 100%   BMI 29.10 kg/m   Assessment and Plan: Tamara Olson is a 57 y.o. female who presents today for her Complete Annual Exam. She feels well. She reports exercising chasing grandbabies. She reports she is sleeping well. Immunizations are reviewed and recommendations provided.   Age appropriate screening tests are discussed. Counseling given for risk factor reduction interventions.   1. Annual physical exam No subjective/objective concerns noted with HPI, review of past medical history and medications, review of systems and physical exam.  Previous labs were reviewed and we will repeat renal function panel at this time. - Renal Function Panel    Elizabeth Sauer, MD

## 2023-08-09 LAB — RENAL FUNCTION PANEL
Albumin: 4.5 g/dL (ref 3.8–4.9)
BUN/Creatinine Ratio: 10 (ref 9–23)
BUN: 11 mg/dL (ref 6–24)
CO2: 22 mmol/L (ref 20–29)
Calcium: 9.7 mg/dL (ref 8.7–10.2)
Chloride: 98 mmol/L (ref 96–106)
Creatinine, Ser: 1.09 mg/dL — ABNORMAL HIGH (ref 0.57–1.00)
Glucose: 120 mg/dL — ABNORMAL HIGH (ref 70–99)
Phosphorus: 2.5 mg/dL — ABNORMAL LOW (ref 3.0–4.3)
Potassium: 3.4 mmol/L — ABNORMAL LOW (ref 3.5–5.2)
Sodium: 140 mmol/L (ref 134–144)
eGFR: 59 mL/min/{1.73_m2} — ABNORMAL LOW (ref >59)

## 2023-08-09 NOTE — Progress Notes (Signed)
PC to pt, discussed lab results. Will call back with any questions or concerns.

## 2023-09-23 ENCOUNTER — Other Ambulatory Visit: Payer: Self-pay | Admitting: Family Medicine

## 2023-09-23 DIAGNOSIS — F410 Panic disorder [episodic paroxysmal anxiety] without agoraphobia: Secondary | ICD-10-CM

## 2023-10-12 ENCOUNTER — Other Ambulatory Visit: Payer: Self-pay | Admitting: Family Medicine

## 2023-10-12 DIAGNOSIS — E782 Mixed hyperlipidemia: Secondary | ICD-10-CM

## 2024-01-02 ENCOUNTER — Other Ambulatory Visit: Payer: Self-pay | Admitting: Family Medicine

## 2024-01-02 DIAGNOSIS — I10 Essential (primary) hypertension: Secondary | ICD-10-CM

## 2024-01-16 ENCOUNTER — Other Ambulatory Visit: Payer: Self-pay | Admitting: Family Medicine

## 2024-01-16 DIAGNOSIS — F410 Panic disorder [episodic paroxysmal anxiety] without agoraphobia: Secondary | ICD-10-CM

## 2024-01-27 ENCOUNTER — Other Ambulatory Visit: Payer: Self-pay | Admitting: Family Medicine

## 2024-01-27 DIAGNOSIS — E876 Hypokalemia: Secondary | ICD-10-CM

## 2024-04-03 ENCOUNTER — Other Ambulatory Visit: Payer: Self-pay | Admitting: Family Medicine

## 2024-04-03 DIAGNOSIS — I10 Essential (primary) hypertension: Secondary | ICD-10-CM

## 2024-05-03 ENCOUNTER — Other Ambulatory Visit: Payer: Self-pay

## 2024-05-03 DIAGNOSIS — E876 Hypokalemia: Secondary | ICD-10-CM

## 2024-05-03 MED ORDER — POTASSIUM CHLORIDE CRYS ER 10 MEQ PO TBCR
10.0000 meq | EXTENDED_RELEASE_TABLET | Freq: Two times a day (BID) | ORAL | 0 refills | Status: DC
Start: 2024-05-03 — End: 2024-09-04

## 2024-07-29 ENCOUNTER — Other Ambulatory Visit: Payer: Self-pay | Admitting: Internal Medicine

## 2024-07-29 DIAGNOSIS — E876 Hypokalemia: Secondary | ICD-10-CM

## 2024-07-30 ENCOUNTER — Telehealth: Payer: Self-pay | Admitting: Family Medicine

## 2024-07-30 NOTE — Telephone Encounter (Signed)
 Pt needs appt before any medication can be sent in. Has appt on 08/06/24.  KP

## 2024-07-30 NOTE — Telephone Encounter (Signed)
 Requested medications are due for refill today.  yes  Requested medications are on the active medications list.  yes  Last refill. 05/03/2024 #180 0 rf  Future visit scheduled.   yes  Notes to clinic.  No pcp listed. Abnormal labs - labs about to expire.    Requested Prescriptions  Pending Prescriptions Disp Refills   potassium chloride  (KLOR-CON  M) 10 MEQ tablet [Pharmacy Med Name: POTASSIUM CL ER 10 MEQ TAB MCR] 180 tablet 0    Sig: TAKE 1 TABLET BY MOUTH 2 TIMES DAILY.     Endocrinology:  Minerals - Potassium Supplementation Failed - 07/30/2024  4:51 PM      Failed - K in normal range and within 360 days    Potassium  Date Value Ref Range Status  08/08/2023 3.4 (L) 3.5 - 5.2 mmol/L Final         Failed - Cr in normal range and within 360 days    Creatinine, Ser  Date Value Ref Range Status  08/08/2023 1.09 (H) 0.57 - 1.00 mg/dL Final         Failed - Valid encounter within last 12 months    Recent Outpatient Visits   None

## 2024-07-30 NOTE — Telephone Encounter (Signed)
 Associated Diagnoses: Essential hypertension [I10]  Original Order: hydrochlorothiazide  (HYDRODIURIL ) 12.5 MG tablet [[id number]]  Providers  Authorizing Provider: Joshua Cathryne BROCKS, MD Phone: -- DEA #: [id number]   NPI: [id number] --      Ordering User: Joshua Cathryne BROCKS, MD      Pharmacy  [facility] [phone] [address] [place], [place] - [address], [place] [address] Phone: [phone]  Fax: [phone] DEA #: [id number]  DAW Reason: --  Patient need a refill til her next appointment.

## 2024-08-01 ENCOUNTER — Other Ambulatory Visit: Payer: Self-pay

## 2024-08-01 DIAGNOSIS — F410 Panic disorder [episodic paroxysmal anxiety] without agoraphobia: Secondary | ICD-10-CM

## 2024-08-02 MED ORDER — HYDROXYZINE HCL 10 MG PO TABS: 10.0000 mg | ORAL_TABLET | Freq: Three times a day (TID) | ORAL | 0 refills | Status: AC | PRN

## 2024-08-06 ENCOUNTER — Encounter: Payer: Self-pay | Admitting: Student

## 2024-08-06 ENCOUNTER — Ambulatory Visit: Admitting: Student

## 2024-08-06 VITALS — BP 114/78 | HR 86 | Ht 61.0 in | Wt 145.2 lb

## 2024-08-06 DIAGNOSIS — J449 Chronic obstructive pulmonary disease, unspecified: Secondary | ICD-10-CM

## 2024-08-06 DIAGNOSIS — F411 Generalized anxiety disorder: Secondary | ICD-10-CM | POA: Diagnosis not present

## 2024-08-06 DIAGNOSIS — F410 Panic disorder [episodic paroxysmal anxiety] without agoraphobia: Secondary | ICD-10-CM

## 2024-08-06 DIAGNOSIS — J3089 Other allergic rhinitis: Secondary | ICD-10-CM

## 2024-08-06 DIAGNOSIS — J309 Allergic rhinitis, unspecified: Secondary | ICD-10-CM | POA: Insufficient documentation

## 2024-08-06 DIAGNOSIS — E782 Mixed hyperlipidemia: Secondary | ICD-10-CM

## 2024-08-06 DIAGNOSIS — I10 Essential (primary) hypertension: Secondary | ICD-10-CM | POA: Diagnosis not present

## 2024-08-06 DIAGNOSIS — K219 Gastro-esophageal reflux disease without esophagitis: Secondary | ICD-10-CM

## 2024-08-06 MED ORDER — FEXOFENADINE HCL 180 MG PO TABS: 180.0000 mg | ORAL_TABLET | Freq: Every day | ORAL | 1 refills | Status: AC

## 2024-08-06 MED ORDER — OMEPRAZOLE MAGNESIUM 20 MG PO TBEC: 20.0000 mg | DELAYED_RELEASE_TABLET | Freq: Every day | ORAL | 1 refills | Status: AC

## 2024-08-06 MED ORDER — AMLODIPINE BESYLATE 2.5 MG PO TABS: 2.5000 mg | ORAL_TABLET | Freq: Every day | ORAL | 1 refills | Status: AC

## 2024-08-06 MED ORDER — SERTRALINE HCL 25 MG PO TABS: 25.0000 mg | ORAL_TABLET | Freq: Every day | ORAL | 1 refills | Status: AC

## 2024-08-06 MED ORDER — SPIRIVA RESPIMAT 2.5 MCG/ACT IN AERS: 2.0000 | INHALATION_SPRAY | Freq: Every day | RESPIRATORY_TRACT | 3 refills | Status: DC

## 2024-08-06 NOTE — Progress Notes (Unsigned)
 Established Patient Office Visit  Subjective   Patient ID: Tamara Olson, female    DOB: 1966/09/09  Age: 58 y.o. MRN: [id number]  Chief Complaint  Patient presents with   Establish Care    Patient is here to establish care with new pcp    Tamara Olson with medical hx listed below presents today for transfer of care from prior PCP Dr. Joshua who retired. Please refer to problem based charting for further details and assessment and plan of current problem and chronic medical conditions.  Patient Active Problem List   Diagnosis Date Noted   HLD (hyperlipidemia) 08/07/2024   GERD (gastroesophageal reflux disease) 08/07/2024   Allergic rhinitis 08/06/2024   GAD (generalized anxiety disorder) 08/06/2024   Status post abdominal hysterectomy 02/25/2021   Encounter for screening colonoscopy 07/28/2015   Overweight 06/01/2015   Essential hypertension 06/01/2015   Smoker 06/01/2015   COPD, 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/01/2015      ROS Refer to HPI    Objective:     Outpatient Encounter Medications as of 08/06/2024  Medication Sig   albuterol  (PROAIR  HFA) 108 (90 Base) MCG/ACT inhaler Inhale 2 puffs into the lungs every 6 (six) hours as needed for wheezing or shortness of breath.   atorvastatin  (LIPITOR) 10 MG tablet TAKE 1 TABLET BY MOUTH EVERY DAY   hydrOXYzine  (ATARAX ) 10 MG tablet Take 1 tablet (10 mg total) by mouth every 8 (eight) hours as needed.   potassium chloride  (KLOR-CON  M) 10 MEQ tablet Take 1 tablet (10 mEq total) by mouth 2 (two) times daily.   Tiotropium Bromide Monohydrate  (SPIRIVA  RESPIMAT) 2.5 MCG/ACT AERS Inhale 2 puffs into the lungs daily.   [DISCONTINUED] amLODipine  (NORVASC ) 2.5 MG tablet TAKE 1 TABLET BY MOUTH EVERY DAY   [DISCONTINUED] fexofenadine  (ALLEGRA ) 180 MG tablet Take 180 mg by mouth daily. otc   [DISCONTINUED] hydrochlorothiazide  (HYDRODIURIL ) 12.5 MG tablet Take 1 tablet (12.5 mg total) by mouth daily.   [DISCONTINUED] omeprazole  (PRILOSEC  OTC) 20 MG  tablet Take 20 mg by mouth daily.   [DISCONTINUED] sertraline  (ZOLOFT ) 25 MG tablet TAKE 1 TABLET (25 MG TOTAL) BY MOUTH DAILY.   amLODipine  (NORVASC ) 2.5 MG tablet Take 1 tablet (2.5 mg total) by mouth daily.   fexofenadine  (ALLEGRA ) 180 MG tablet Take 1 tablet (180 mg total) by mouth daily. otc   omeprazole  (PRILOSEC  OTC) 20 MG tablet Take 1 tablet (20 mg total) by mouth daily.   sertraline  (ZOLOFT ) 25 MG tablet Take 1 tablet (25 mg total) by mouth daily.   No facility-administered encounter medications on file as of 08/06/2024.    BP 114/78   Pulse 86   Ht 5' 1 (1.549 m)   Wt 145 lb 4 oz (65.9 kg)   SpO2 99%   BMI 27.44 kg/m  BP Readings from Last 3 Encounters:  08/06/24 114/78  08/08/23 120/82  08/01/23 118/78    Physical Exam     08/07/2024   12:40 PM 08/08/2023    8:44 AM 08/01/2023    1:08 PM  Depression screen PHQ 2/9  Decreased Interest 0 0 0  Down, Depressed, Hopeless 0 0 0  PHQ - 2 Score 0 0 0  Altered sleeping  0 0  Tired, decreased energy  0 0  Change in appetite  0 0  Feeling bad or failure about yourself   0 0  Trouble concentrating  0 0  Moving slowly or fidgety/restless  0 0  Suicidal thoughts  0 0  PHQ-9 Score  0 0  Difficult doing work/chores  Not difficult at all Not difficult at all       08/08/2023    8:45 AM 08/01/2023    1:08 PM 02/02/2023    1:19 PM 08/04/2022    1:46 PM  GAD 7 : Generalized Anxiety Score  Nervous, Anxious, on Edge 0 0 0 0  Control/stop worrying 0 0 0 0  Worry too much - different things 0 0 0 0  Trouble relaxing 0 0 0 0  Restless 0 0 0 0  Easily annoyed or irritable 0 0 0 0  Afraid - awful might happen 0 0 0 0  Total GAD 7 Score 0 0 0 0  Anxiety Difficulty Not difficult at all Not difficult at all Not difficult at all Not difficult at all    Results for orders placed or performed in visit on 08/06/24  Basic metabolic panel with GFR  Result Value Ref Range   Glucose 89 70 - 99 mg/dL   BUN 17 6 - 24 mg/dL    Creatinine, Ser 8.85 (H) 0.57 - 1.00 mg/dL   eGFR 55 (L) >40 fO/fpw/8.26   BUN/Creatinine Ratio 15 9 - 23   Sodium 141 134 - 144 mmol/L   Potassium 3.9 3.5 - 5.2 mmol/L   Chloride 101 96 - 106 mmol/L   CO2 22 20 - 29 mmol/L   Calcium  9.4 8.7 - 10.2 mg/dL  Lipid panel  Result Value Ref Range   Cholesterol, Total 166 100 - 199 mg/dL   Triglycerides 863 0 - 149 mg/dL   HDL 87 >60 mg/dL   VLDL Cholesterol Cal 23 5 - 40 mg/dL   LDL Chol Calc (NIH) 56 0 - 99 mg/dL   Chol/HDL Ratio 1.9 0.0 - 4.4 ratio    Last CBC Lab Results  Component Value Date   WBC 8.6 06/01/2015   HGB 14.3 06/01/2015   HCT 43.5 06/01/2015   MCV 96 06/01/2015   MCH 31.6 06/01/2015   RDW 13.1 06/01/2015   PLT 329 06/01/2015   Last metabolic panel Lab Results  Component Value Date   GLUCOSE 89 08/06/2024   NA 141 08/06/2024   K 3.9 08/06/2024   CL 101 08/06/2024   CO2 22 08/06/2024   BUN 17 08/06/2024   CREATININE 1.14 (H) 08/06/2024   EGFR 55 (L) 08/06/2024   CALCIUM  9.4 08/06/2024   PHOS 2.5 (L) 08/08/2023   PROT 6.9 08/01/2023   ALBUMIN 4.5 08/08/2023   LABGLOB 2.8 08/01/2023   AGRATIO 1.7 07/14/2022   BILITOT 0.3 08/01/2023   ALKPHOS 94 08/01/2023   AST 24 08/01/2023   ALT 12 08/01/2023   ANIONGAP 13 05/22/2015   Last lipids Lab Results  Component Value Date   CHOL 166 08/06/2024   HDL 87 08/06/2024   LDLCALC 56 08/06/2024   TRIG 136 08/06/2024   CHOLHDL 1.9 08/06/2024   Last hemoglobin A1c Lab Results  Component Value Date   HGBA1C 5.5 06/01/2015   Last thyroid functions Lab Results  Component Value Date   TSH 4.090 07/07/2020      The 10-year ASCVD risk score (Arnett DK, et al., 2019) is: 4.2%    Assessment & Plan:  Essential hypertension Assessment & Plan: Well controlled on Amlodipine  2.5 mg daily and hydrochlorothiazide  12.5 mg daily. K has been low in the past. And is supplementation with 20mEq daily. BMP today. If remains low may benefit from switching to  ARB.  Orders: -  Basic metabolic panel with GFR -     amLODIPine  Besylate; Take 1 tablet (2.5 mg total) by mouth daily.  Dispense: 90 tablet; Refill: 1  COPD, mild (HCC) Assessment & Plan: Using albuterol  3-4 times a week in summer, uses less at other times in the year. Seems to have more symptoms with hot weather.Otherwise minimal symptoms with exertion. Does have significant cough or sputum production. Smokes 10-15 cigarettes a day. Overall seems to have more breathing issue lately, no hx of exacerbations. Pre contemplative regarding cessation.  -Start Spiriva  -Continue albuterol  as needed   -encouraged smoking cessation    Allergic rhinitis due to other allergic trigger, unspecified seasonality Assessment & Plan: Uses allergra with good results. Usuawly worse in the summer when she is outside. Continue allegra  180mg  daily    GAD (generalized anxiety disorder) Assessment & Plan: Mostly related to several close calls while driving. 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 Doing well on zoloft  25 mg daily.  She has hydroxyzine  10 mg as needed for claustrophobia, which she rarely requires.    Generalized anxiety disorder with panic attacks -     Sertraline  HCl; Take 1 tablet (25 mg total) by mouth daily.  Dispense: 90 tablet; Refill: 1  Mixed hyperlipidemia Assessment & Plan: Lipid Panel     Component Value Date/Time   CHOL 166 08/06/2024 1526   TRIG 136 08/06/2024 1526   HDL 87 08/06/2024 1526   CHOLHDL 1.9 08/06/2024 1526   LDLCALC 56 08/06/2024 1526   LABVLDL 23 08/06/2024 1526  Improved Ldl to 56. Tolerating atorvastatin  10 mg daily. The 10-year ASCVD risk score (Arnett DK, et al., 2019) is: 4.2%. Continue current medication.  Orders: -     Lipid panel  Gastroesophageal reflux disease, unspecified whether esophagitis present Assessment & Plan: Using omeprazole  as needed, symptoms well controlled. Occurs with spicy food. No abdominal pain, n/v/d, dysphagia, or weight  loss. Continue omeprazole  and avoidance of acidic and spicy foods.    Other orders -     Spiriva  Respimat; Inhale 2 puffs into the lungs daily.  Dispense: 12 g; Refill: 3 -     Fexofenadine  HCl; Take 1 tablet (180 mg total) by mouth daily. otc  Dispense: 90 tablet; Refill: 1 -     Omeprazole  Magnesium ; Take 1 tablet (20 mg total) by mouth daily.  Dispense: 90 tablet; Refill: 1     Return in about 4 weeks (around 09/03/2024) for physical.    Harlene Saddler, MD

## 2024-08-06 NOTE — Assessment & Plan Note (Signed)
 Uses allergra with good results. Usuawly worse in the summer when she is outside. Continue allegra  180mg  daily

## 2024-08-06 NOTE — Assessment & Plan Note (Signed)
 Using albuterol  3-4 times a week in summer, uses less at other times in the year. Smokes 10-15 cigarettes a day. Overall

## 2024-08-06 NOTE — Assessment & Plan Note (Signed)
 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 She has hydroxyzine  10 mg as needed for claustrophobia,

## 2024-08-06 NOTE — Assessment & Plan Note (Signed)
 Well controlled on Amlodipine  2.5 mg daily and hydrochlorothiazide  12.5 mg daily. K has been low in the past. And is supplementation with 20mEq daily. BMP today. If remains low may benefit from switching to ARB.

## 2024-08-07 ENCOUNTER — Ambulatory Visit: Payer: Self-pay | Admitting: Student

## 2024-08-07 ENCOUNTER — Encounter: Payer: Self-pay | Admitting: Student

## 2024-08-07 DIAGNOSIS — E785 Hyperlipidemia, unspecified: Secondary | ICD-10-CM | POA: Insufficient documentation

## 2024-08-07 DIAGNOSIS — K219 Gastro-esophageal reflux disease without esophagitis: Secondary | ICD-10-CM | POA: Insufficient documentation

## 2024-08-07 DIAGNOSIS — I10 Essential (primary) hypertension: Secondary | ICD-10-CM

## 2024-08-07 LAB — LIPID PANEL
Chol/HDL Ratio: 1.9 ratio (ref 0.0–4.4)
Cholesterol, Total: 166 mg/dL (ref 100–199)
HDL: 87 mg/dL (ref >39)
LDL Chol Calc (NIH): 56 mg/dL (ref 0–99)
Triglycerides: 136 mg/dL (ref 0–149)
VLDL Cholesterol Cal: 23 mg/dL (ref 5–40)

## 2024-08-07 LAB — BASIC METABOLIC PANEL WITH GFR
BUN/Creatinine Ratio: 15 (ref 9–23)
BUN: 17 mg/dL (ref 6–24)
CO2: 22 mmol/L (ref 20–29)
Calcium: 9.4 mg/dL (ref 8.7–10.2)
Chloride: 101 mmol/L (ref 96–106)
Creatinine, Ser: 1.14 mg/dL — ABNORMAL HIGH (ref 0.57–1.00)
Glucose: 89 mg/dL (ref 70–99)
Potassium: 3.9 mmol/L (ref 3.5–5.2)
Sodium: 141 mmol/L (ref 134–144)
eGFR: 55 mL/min/1.73 — ABNORMAL LOW (ref >59)

## 2024-08-07 MED ORDER — HYDROCHLOROTHIAZIDE 12.5 MG PO TABS: 12.5000 mg | ORAL_TABLET | Freq: Every day | ORAL | 1 refills | Status: AC

## 2024-08-07 NOTE — Assessment & Plan Note (Signed)
 Using omeprazole  as needed, symptoms well controlled. Occurs with spicy food. No abdominal pain, n/v/d, dysphagia, or weight loss. Continue omeprazole  and avoidance of acidic and spicy foods.

## 2024-08-07 NOTE — Assessment & Plan Note (Addendum)
 Lipid Panel     Component Value Date/Time   CHOL 166 08/06/2024 1526   TRIG 136 08/06/2024 1526   HDL 87 08/06/2024 1526   CHOLHDL 1.9 08/06/2024 1526   LDLCALC 56 08/06/2024 1526   LABVLDL 23 08/06/2024 1526  Improved Ldl to 56. Tolerating atorvastatin  10 mg daily. The 10-year ASCVD risk score (Arnett DK, et al., 2019) is: 4.2%. Continue current medication.

## 2024-08-14 ENCOUNTER — Other Ambulatory Visit: Payer: Self-pay

## 2024-08-14 DIAGNOSIS — J449 Chronic obstructive pulmonary disease, unspecified: Secondary | ICD-10-CM

## 2024-08-14 MED ORDER — ALBUTEROL SULFATE HFA 108 (90 BASE) MCG/ACT IN AERS: 2.0000 | INHALATION_SPRAY | Freq: Four times a day (QID) | RESPIRATORY_TRACT | 3 refills | Status: AC | PRN

## 2024-08-20 ENCOUNTER — Encounter: Payer: Self-pay | Admitting: Family Medicine

## 2024-09-04 ENCOUNTER — Encounter: Payer: Self-pay | Admitting: Student

## 2024-09-04 ENCOUNTER — Ambulatory Visit (INDEPENDENT_AMBULATORY_CARE_PROVIDER_SITE_OTHER): Admitting: Student

## 2024-09-04 VITALS — BP 120/82 | HR 61 | Ht 61.0 in | Wt 143.0 lb

## 2024-09-04 DIAGNOSIS — Z1211 Encounter for screening for malignant neoplasm of colon: Secondary | ICD-10-CM

## 2024-09-04 DIAGNOSIS — E782 Mixed hyperlipidemia: Secondary | ICD-10-CM | POA: Diagnosis not present

## 2024-09-04 DIAGNOSIS — Z0000 Encounter for general adult medical examination without abnormal findings: Secondary | ICD-10-CM

## 2024-09-04 DIAGNOSIS — J3089 Other allergic rhinitis: Secondary | ICD-10-CM

## 2024-09-04 DIAGNOSIS — I10 Essential (primary) hypertension: Secondary | ICD-10-CM | POA: Diagnosis not present

## 2024-09-04 DIAGNOSIS — F17200 Nicotine dependence, unspecified, uncomplicated: Secondary | ICD-10-CM

## 2024-09-04 DIAGNOSIS — Z1212 Encounter for screening for malignant neoplasm of rectum: Secondary | ICD-10-CM

## 2024-09-04 DIAGNOSIS — Z23 Encounter for immunization: Secondary | ICD-10-CM | POA: Diagnosis not present

## 2024-09-04 DIAGNOSIS — F411 Generalized anxiety disorder: Secondary | ICD-10-CM

## 2024-09-04 DIAGNOSIS — J449 Chronic obstructive pulmonary disease, unspecified: Secondary | ICD-10-CM

## 2024-09-04 MED ORDER — FLUTICASONE PROPIONATE 50 MCG/ACT NA SUSP: 2.0000 | Freq: Every day | NASAL | 1 refills | Status: DC

## 2024-09-04 MED ORDER — ATORVASTATIN CALCIUM 10 MG PO TABS: 10.0000 mg | ORAL_TABLET | Freq: Every day | ORAL | 1 refills | Status: AC

## 2024-09-04 NOTE — Assessment & Plan Note (Addendum)
 Well controlled on amlodipine  2.5 mg daily and hydrochlorothiazide  12.5 mg daily. K normal at last visit. Will hold off on continues potassium for now.

## 2024-09-04 NOTE — Progress Notes (Signed)
 Complete physical exam  Patient: Tamara Olson   DOB: August 25, 1966   58 y.o. Female  MRN: [id number]  Subjective:    Chief Complaint  Patient presents with   Annual Exam    Tamara Olson is a 58 y.o. female who presents today for a complete physical exam. She reports consuming a general diet. The patient has a physically strenuous job, but has no regular exercise apart from work.  She generally feels well. She reports sleeping well. She does not have additional problems to discuss today.   Vision:Not within last year   Patient Active Problem List   Diagnosis Date Noted   HLD (hyperlipidemia) 08/07/2024   GERD (gastroesophageal reflux disease) 08/07/2024   Allergic rhinitis 08/06/2024   GAD (generalized anxiety disorder) 08/06/2024   Status post abdominal hysterectomy 02/25/2021   Annual physical exam 07/28/2015   Overweight 06/01/2015   Essential hypertension 06/01/2015   Smoker 06/01/2015   COPD, 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/01/2015      Patient Care Team: Lemon Raisin, MD as PCP - General (Internal Medicine) Fleeta Milks, Lamar ORN, MD (Unknown Physician Specialty) Dessa Reyes ORN, MD (General Surgery)   Outpatient Medications Prior to Visit  Medication Sig   albuterol  (PROAIR  HFA) 108 (90 Base) MCG/ACT inhaler Inhale 2 puffs into the lungs every 6 (six) hours as needed for wheezing or shortness of breath.   amLODipine  (NORVASC ) 2.5 MG tablet Take 1 tablet (2.5 mg total) by mouth daily.   fexofenadine  (ALLEGRA ) 180 MG tablet Take 1 tablet (180 mg total) by mouth daily. otc   hydrochlorothiazide  (HYDRODIURIL ) 12.5 MG tablet Take 1 tablet (12.5 mg total) by mouth daily.   hydrOXYzine  (ATARAX ) 10 MG tablet Take 1 tablet (10 mg total) by mouth every 8 (eight) hours as needed.   omeprazole  (PRILOSEC  OTC) 20 MG tablet Take 1 tablet (20 mg total) by mouth daily.   sertraline  (ZOLOFT ) 25 MG tablet Take 1 tablet (25 mg total) by mouth daily.   Tiotropium Bromide Monohydrate  (SPIRIVA   RESPIMAT) 2.5 MCG/ACT AERS Inhale 2 puffs into the lungs daily.   [DISCONTINUED] atorvastatin  (LIPITOR) 10 MG tablet TAKE 1 TABLET BY MOUTH EVERY DAY   [DISCONTINUED] potassium chloride  (KLOR-CON  M) 10 MEQ tablet Take 1 tablet (10 mEq total) by mouth 2 (two) times daily.   No facility-administered medications prior to visit.    ROS Refer to HPI     Objective:    BP 120/82   Pulse 61   Ht 5' 1 (1.549 m)   Wt 143 lb (64.9 kg)   SpO2 96%   BMI 27.02 kg/m  BP Readings from Last 3 Encounters:  09/04/24 120/82  08/06/24 114/78  08/08/23 120/82    Physical Exam Constitutional:      Appearance: Normal appearance.  HENT:     Head: Normocephalic and atraumatic.     Mouth/Throat:     Mouth: Mucous membranes are moist.     Pharynx: Oropharynx is clear. No oropharyngeal exudate or posterior oropharyngeal erythema.  Eyes:     Extraocular Movements: Extraocular movements intact.     Conjunctiva/sclera: Conjunctivae normal.     Pupils: Pupils are equal, round, and reactive to light.  Cardiovascular:     Rate and Rhythm: Normal rate and regular rhythm.     Heart sounds: No murmur heard. Pulmonary:     Effort: Pulmonary effort is normal.     Breath sounds: No rhonchi or rales.  Abdominal:     General: Abdomen is flat. Bowel  sounds are normal. There is no distension.     Palpations: Abdomen is soft.     Tenderness: There is no abdominal tenderness.  Musculoskeletal:        General: Normal range of motion.     Cervical back: No tenderness.     Right lower leg: No edema.     Left lower leg: No edema.  Lymphadenopathy:     Cervical: No cervical adenopathy.  Skin:    General: Skin is warm and dry.     Capillary Refill: Capillary refill takes less than 2 seconds.  Neurological:     General: No focal deficit present.     Mental Status: She is alert and oriented to person, place, and time.  Psychiatric:        Mood and Affect: Mood normal.        Behavior: Behavior normal.          09/04/2024    8:01 AM 08/07/2024   12:40 PM 08/08/2023    8:44 AM  Depression screen PHQ 2/9  Decreased Interest 0 0 0  Down, Depressed, Hopeless 0 0 0  PHQ - 2 Score 0 0 0  Altered sleeping   0  Tired, decreased energy   0  Change in appetite   0  Feeling bad or failure about yourself    0  Trouble concentrating   0  Moving slowly or fidgety/restless   0  Suicidal thoughts   0  PHQ-9 Score   0  Difficult doing work/chores   Not difficult at all      09/04/2024    8:01 AM 08/08/2023    8:45 AM 08/01/2023    1:08 PM 02/02/2023    1:19 PM  GAD 7 : Generalized Anxiety Score  Nervous, Anxious, on Edge 0 0 0 0  Control/stop worrying 0 0 0 0  Worry too much - different things 0 0 0 0  Trouble relaxing 0 0 0 0  Restless 0 0 0 0  Easily annoyed or irritable 0 0 0 0  Afraid - awful might happen 0 0 0 0  Total GAD 7 Score 0 0 0 0  Anxiety Difficulty Not difficult at all Not difficult at all Not difficult at all Not difficult at all    No results found for any visits on 09/04/24. Last CBC Lab Results  Component Value Date   WBC 8.6 06/01/2015   HGB 14.3 06/01/2015   HCT 43.5 06/01/2015   MCV 96 06/01/2015   MCH 31.6 06/01/2015   RDW 13.1 06/01/2015   PLT 329 06/01/2015   Last metabolic panel Lab Results  Component Value Date   GLUCOSE 89 08/06/2024   NA 141 08/06/2024   K 3.9 08/06/2024   CL 101 08/06/2024   CO2 22 08/06/2024   BUN 17 08/06/2024   CREATININE 1.14 (H) 08/06/2024   EGFR 55 (L) 08/06/2024   CALCIUM  9.4 08/06/2024   PHOS 2.5 (L) 08/08/2023   PROT 6.9 08/01/2023   ALBUMIN 4.5 08/08/2023   LABGLOB 2.8 08/01/2023   AGRATIO 1.7 07/14/2022   BILITOT 0.3 08/01/2023   ALKPHOS 94 08/01/2023   AST 24 08/01/2023   ALT 12 08/01/2023   ANIONGAP 13 05/22/2015   Last lipids Lab Results  Component Value Date   CHOL 166 08/06/2024   HDL 87 08/06/2024   LDLCALC 56 08/06/2024   TRIG 136 08/06/2024   CHOLHDL 1.9 08/06/2024   Last hemoglobin A1c Lab  Results  Component  Value Date   HGBA1C 5.5 06/01/2015   Last thyroid functions Lab Results  Component Value Date   TSH 4.090 07/07/2020        Assessment & Plan:    Routine Health Maintenance and Physical Exam  Health Maintenance  Topic Date Due   Hepatitis B Vaccine (1 of 3 - 19+ 3-dose series) Never done   Cologuard (Stool DNA test)  Never done   Screening for Lung Cancer  Never done   Breast Cancer Screening  08/27/2023   COVID-19 Vaccine (3 - Pfizer risk series) 08/22/2025*   DTaP/Tdap/Td vaccine (2 - Td or Tdap) 07/07/2026   Pneumococcal Vaccine for age over 1  Completed   Flu Shot  Completed   HPV Vaccine  Aged Out   Meningitis B Vaccine  Aged Out   Hepatitis C Screening  Discontinued   HIV Screening  Discontinued   Zoster (Shingles) Vaccine  Discontinued  *Topic was postponed. The date shown is not the original due date.    Discussed health benefits of physical activity, and encouraged her to engage in regular exercise appropriate for her age and condition.  Annual physical exam Assessment & Plan: Will have mammogram at work, she will have results forwarded S/p TAH for menorrhagia  Cologuard ordered Flu and pneumonia vaccine Discussed lung cancer screening, she declined this    Orders: -     CBC with Differential/Platelet -     Hemoglobin A1c -     TSH -     Comprehensive metabolic panel with GFR  Essential hypertension Assessment & Plan: Well controlled on amlodipine  2.5 mg daily and hydrochlorothiazide  12.5 mg daily. K normal at last visit. Will hold off on continues potassium for now.    Mixed hyperlipidemia Assessment & Plan: The 10-year ASCVD risk score (Arnett DK, et al., 2019) is: 4.7% continue atorvastatin  10 mg daily.   Orders: -     Atorvastatin  Calcium ; Take 1 tablet (10 mg total) by mouth daily.  Dispense: 90 tablet; Refill: 1  Screening for colorectal cancer -     Cologuard  Smoker Assessment & Plan: Currently smoking about 1  ppd, since age 39, is pre contemplative about cessation. Has quit for ~3 years in the past. Will revisit periodically.    GAD (generalized anxiety disorder) Assessment & Plan: Mood is well controlled    Allergic rhinitis due to other allergic trigger, unspecified seasonality Assessment & Plan: Has come rhinorrhea, is taking allegra  daily, start flonase as needed for    COPD, mild (HCC) Assessment & Plan: Doing well on spiriva  and albuterol     Encounter for immunization -     Pneumococcal conjugate vaccine 20-valent -     Flu vaccine trivalent PF, 6mos and older(Flulaval,Afluria,Fluarix,Fluzone)  Other orders -     Fluticasone Propionate; Place 2 sprays into both nostrils daily.  Dispense: 16 g; Refill: 1    Return in about 6 months (around 03/05/2025) for HTN, HLD .     Harlene Saddler, MD

## 2024-09-04 NOTE — Assessment & Plan Note (Signed)
 Doing well on spiriva  and albuterol 

## 2024-09-04 NOTE — Patient Instructions (Addendum)
 It was a pleasure seeing you in clinic today.  We gave you your flu and pneumonia vaccine today Please pick up flonase 2 sprays 1-2 times a day as needed for allergies I will contact you with your labs results You will get a cologuard kit for colon cancer screening mailed to you. Please let me know if you become interested in lung cancer screening Please send in you mammogram results once you have completed this.  Please follow up in 6 month

## 2024-09-04 NOTE — Assessment & Plan Note (Addendum)
 Will have mammogram at work, she will have results forwarded S/p TAH for menorrhagia  Cologuard ordered Flu and pneumonia vaccine Discussed lung cancer screening, she declined this

## 2024-09-04 NOTE — Assessment & Plan Note (Signed)
 Has come rhinorrhea, is taking allegra  daily, start flonase as needed for

## 2024-09-04 NOTE — Assessment & Plan Note (Addendum)
 Currently smoking about 1 ppd, since age 58, is pre contemplative about cessation. Has quit for ~3 years in the past. Will revisit periodically.

## 2024-09-04 NOTE — Assessment & Plan Note (Signed)
 The 10-year ASCVD risk score (Arnett DK, et al., 2019) is: 4.7% continue atorvastatin  10 mg daily.

## 2024-09-04 NOTE — Assessment & Plan Note (Signed)
Mood is well controlled

## 2024-09-05 ENCOUNTER — Ambulatory Visit: Payer: Self-pay | Admitting: Student

## 2024-09-05 LAB — COMPREHENSIVE METABOLIC PANEL WITH GFR
ALT: 12 IU/L (ref 0–32)
AST: 18 IU/L (ref 0–40)
Albumin: 4.6 g/dL (ref 3.8–4.9)
Alkaline Phosphatase: 91 IU/L (ref 49–135)
BUN/Creatinine Ratio: 16 (ref 9–23)
BUN: 15 mg/dL (ref 6–24)
Bilirubin Total: <0.2 mg/dL (ref 0.0–1.2)
CO2: 24 mmol/L (ref 20–29)
Calcium: 10.1 mg/dL (ref 8.7–10.2)
Chloride: 97 mmol/L (ref 96–106)
Creatinine, Ser: 0.92 mg/dL (ref 0.57–1.00)
Globulin, Total: 2.7 g/dL (ref 1.5–4.5)
Glucose: 93 mg/dL (ref 70–99)
Potassium: 3.6 mmol/L (ref 3.5–5.2)
Sodium: 137 mmol/L (ref 134–144)
Total Protein: 7.3 g/dL (ref 6.0–8.5)
eGFR: 72 mL/min/1.73 (ref >59)

## 2024-09-05 LAB — CBC WITH DIFFERENTIAL/PLATELET
Basophils Absolute: 0.1 x10E3/uL (ref 0.0–0.2)
Basos: 1 %
EOS (ABSOLUTE): 0.1 x10E3/uL (ref 0.0–0.4)
Eos: 1 %
Hematocrit: 41.8 % (ref 34.0–46.6)
Hemoglobin: 14.1 g/dL (ref 11.1–15.9)
Immature Grans (Abs): 0.1 x10E3/uL (ref 0.0–0.1)
Immature Granulocytes: 2 %
Lymphocytes Absolute: 1.7 x10E3/uL (ref 0.7–3.1)
Lymphs: 21 %
MCH: 33.5 pg — ABNORMAL HIGH (ref 26.6–33.0)
MCHC: 33.7 g/dL (ref 31.5–35.7)
MCV: 99 fL — ABNORMAL HIGH (ref 79–97)
Monocytes Absolute: 0.7 x10E3/uL (ref 0.1–0.9)
Monocytes: 9 %
Neutrophils Absolute: 5.6 x10E3/uL (ref 1.4–7.0)
Neutrophils: 66 %
Platelets: 440 x10E3/uL (ref 150–450)
RBC: 4.21 x10E6/uL (ref 3.77–5.28)
RDW: 14.2 % (ref 11.7–15.4)
WBC: 8.4 x10E3/uL (ref 3.4–10.8)

## 2024-09-05 LAB — HEMOGLOBIN A1C
Est. average glucose Bld gHb Est-mCnc: 111 mg/dL
Hgb A1c MFr Bld: 5.5 % (ref 4.8–5.6)

## 2024-09-05 LAB — TSH: TSH: 2.09 u[IU]/mL (ref 0.450–4.500)

## 2024-09-06 ENCOUNTER — Other Ambulatory Visit: Payer: Self-pay | Admitting: Student

## 2024-09-06 DIAGNOSIS — Z1231 Encounter for screening mammogram for malignant neoplasm of breast: Secondary | ICD-10-CM

## 2024-09-12 ENCOUNTER — Ambulatory Visit
Admission: RE | Admit: 2024-09-12 | Discharge: 2024-09-12 | Disposition: A | Discharge status: Home / Self Care | Source: Ambulatory Visit | Attending: Student | Admitting: Student

## 2024-09-12 DIAGNOSIS — Z1231 Encounter for screening mammogram for malignant neoplasm of breast: Secondary | ICD-10-CM

## 2024-09-23 LAB — COLOGUARD: COLOGUARD: POSITIVE — AB

## 2024-09-25 ENCOUNTER — Telehealth: Payer: Self-pay

## 2024-09-25 DIAGNOSIS — R195 Other fecal abnormalities: Secondary | ICD-10-CM

## 2024-09-25 NOTE — Telephone Encounter (Signed)
 Referral placed for colonoscopy.  KP

## 2024-09-25 NOTE — Progress Notes (Signed)
 Referral placed. Pt is aware.  KP

## 2024-09-27 ENCOUNTER — Other Ambulatory Visit: Payer: Self-pay | Admitting: Student

## 2024-09-30 NOTE — Telephone Encounter (Signed)
 Duplicate request, too soon for refill.  Requested Prescriptions  Pending Prescriptions Disp Refills   fluticasone (FLONASE) 50 MCG/ACT nasal spray [Pharmacy Med Name: FLUTICASONE PROP 50 MCG SPRAY] 48 mL 1    Sig: SPRAY 2 SPRAYS INTO EACH NOSTRIL EVERY DAY     Ear, Nose, and Throat: Nasal Preparations - Corticosteroids Passed - 09/30/2024 11:26 AM      Passed - Valid encounter within last 12 months    Recent Outpatient Visits           3 weeks ago Annual physical exam   [facility], MD   1 month ago Essential hypertension   [facility] at [facility], MD

## 2024-10-15 ENCOUNTER — Telehealth: Payer: Self-pay

## 2024-10-15 ENCOUNTER — Other Ambulatory Visit: Payer: Self-pay

## 2024-10-15 DIAGNOSIS — Z1211 Encounter for screening for malignant neoplasm of colon: Secondary | ICD-10-CM

## 2024-10-15 DIAGNOSIS — R195 Other fecal abnormalities: Secondary | ICD-10-CM

## 2024-10-15 MED ORDER — NA SULFATE-K SULFATE-MG SULF 17.5-3.13-1.6 GM/177ML PO SOLN: 1.0000 | Freq: Once | ORAL | 0 refills | Status: AC

## 2024-10-15 NOTE — Telephone Encounter (Signed)
 Gastroenterology Pre-Procedure Review  Request Date: 01/20/25 Requesting Physician: Dr. Jinny  PATIENT REVIEW QUESTIONS: The patient responded to the following health history questions as indicated:    1. Are you having any GI issues? no positive cologuard 2. Do you have a personal history of Polyps? no 3. Do you have a family history of Colon Cancer or Polyps? no 4. Diabetes Mellitus? no 5. Joint replacements in the past 12 months?no 6. Major health problems in the past 3 months?no 7. Any artificial heart valves, MVP, or defibrillator?no    MEDICATIONS & ALLERGIES:    Patient reports the following regarding taking any anticoagulation/antiplatelet therapy:   Plavix, Coumadin, Eliquis, Xarelto, Lovenox, Pradaxa, Brilinta, or Effient? no Aspirin? no  Patient confirms/reports the following medications:  Current Outpatient Medications  Medication Sig Dispense Refill   albuterol  (PROAIR  HFA) 108 (90 Base) MCG/ACT inhaler Inhale 2 puffs into the lungs every 6 (six) hours as needed for wheezing or shortness of breath. 18 g 3   amLODipine  (NORVASC ) 2.5 MG tablet Take 1 tablet (2.5 mg total) by mouth daily. 90 tablet 1   atorvastatin  (LIPITOR) 10 MG tablet Take 1 tablet (10 mg total) by mouth daily. 90 tablet 1   fexofenadine  (ALLEGRA ) 180 MG tablet Take 1 tablet (180 mg total) by mouth daily. otc 90 tablet 1   fluticasone  (FLONASE ) 50 MCG/ACT nasal spray Place 2 sprays into both nostrils daily. 16 g 1   hydrochlorothiazide  (HYDRODIURIL ) 12.5 MG tablet Take 1 tablet (12.5 mg total) by mouth daily. 90 tablet 1   hydrOXYzine  (ATARAX ) 10 MG tablet Take 1 tablet (10 mg total) by mouth every 8 (eight) hours as needed. 30 tablet 0   omeprazole  (PRILOSEC  OTC) 20 MG tablet Take 1 tablet (20 mg total) by mouth daily. 90 tablet 1   sertraline  (ZOLOFT ) 25 MG tablet Take 1 tablet (25 mg total) by mouth daily. 90 tablet 1   Tiotropium Bromide Monohydrate  (SPIRIVA  RESPIMAT) 2.5 MCG/ACT AERS Inhale 2 puffs  into the lungs daily. 12 g 3   No current facility-administered medications for this visit.    Patient confirms/reports the following allergies:  No Known Allergies  No orders of the defined types were placed in this encounter.   AUTHORIZATION INFORMATION Primary Insurance: 1D#: Group #:  Secondary Insurance: 1D#: Group #:  SCHEDULE INFORMATION: Date: 01/20/25 Time: Location: [facility]

## 2024-10-29 ENCOUNTER — Other Ambulatory Visit: Payer: Self-pay | Admitting: Student

## 2024-10-31 NOTE — Telephone Encounter (Signed)
 Requested by interface [employer]. Future visit 03/05/25.  Requested Prescriptions  Pending Prescriptions Disp Refills   fluticasone  (FLONASE ) 50 MCG/ACT nasal spray [Pharmacy Med Name: FLUTICASONE  PROP 50 MCG SPRAY] 16 mL 1    Sig: SPRAY 2 SPRAYS INTO EACH NOSTRIL EVERY DAY     Ear, Nose, and Throat: Nasal Preparations - Corticosteroids Passed - 10/31/2024  2:00 PM      Passed - Valid encounter within last 12 months    Recent Outpatient Visits           1 month ago Annual physical exam   [facility], MD   2 months ago Essential hypertension   [facility], MD

## 2024-11-23 ENCOUNTER — Other Ambulatory Visit: Payer: Self-pay | Admitting: Student

## 2024-11-25 MED ORDER — SPIRIVA RESPIMAT 2.5 MCG/ACT IN AERS: 2.0000 | INHALATION_SPRAY | Freq: Every day | RESPIRATORY_TRACT | 3 refills | Status: AC

## 2025-01-20 ENCOUNTER — Ambulatory Visit: Admit: 2025-01-20 | Admitting: Gastroenterology

## 2025-01-20 SURGERY — COLONOSCOPY
Anesthesia: General

## 2025-03-05 ENCOUNTER — Ambulatory Visit: Admitting: Student
